# Patient Record
Sex: Female | Born: 1945 | Race: White | Hispanic: No | Marital: Single | Smoking: Never smoker
Health system: Southern US, Community
[De-identification: ages and names within clinical notes are randomized; demographics above are authoritative.]

## PROBLEM LIST (undated history)

## (undated) DIAGNOSIS — M51369 Other intervertebral disc degeneration, lumbar region without mention of lumbar back pain or lower extremity pain: Secondary | ICD-10-CM

## (undated) DIAGNOSIS — J309 Allergic rhinitis, unspecified: Secondary | ICD-10-CM

## (undated) DIAGNOSIS — E039 Hypothyroidism, unspecified: Secondary | ICD-10-CM

## (undated) DIAGNOSIS — E049 Nontoxic goiter, unspecified: Secondary | ICD-10-CM

## (undated) DIAGNOSIS — R011 Cardiac murmur, unspecified: Secondary | ICD-10-CM

## (undated) DIAGNOSIS — E785 Hyperlipidemia, unspecified: Secondary | ICD-10-CM

## (undated) DIAGNOSIS — K219 Gastro-esophageal reflux disease without esophagitis: Secondary | ICD-10-CM

## (undated) DIAGNOSIS — E782 Mixed hyperlipidemia: Secondary | ICD-10-CM

## (undated) DIAGNOSIS — M5136 Other intervertebral disc degeneration, lumbar region: Secondary | ICD-10-CM

## (undated) DIAGNOSIS — G8929 Other chronic pain: Secondary | ICD-10-CM

## (undated) DIAGNOSIS — IMO0001 Reserved for inherently not codable concepts without codable children: Secondary | ICD-10-CM

## (undated) DIAGNOSIS — E119 Type 2 diabetes mellitus without complications: Secondary | ICD-10-CM

## (undated) DIAGNOSIS — M549 Dorsalgia, unspecified: Secondary | ICD-10-CM

## (undated) DIAGNOSIS — I10 Essential (primary) hypertension: Secondary | ICD-10-CM

## (undated) DIAGNOSIS — J45909 Unspecified asthma, uncomplicated: Secondary | ICD-10-CM

## (undated) DIAGNOSIS — R9431 Abnormal electrocardiogram [ECG] [EKG]: Secondary | ICD-10-CM

## (undated) HISTORY — DX: Type 2 diabetes mellitus without complications: E11.9

## (undated) HISTORY — DX: Gastro-esophageal reflux disease without esophagitis: K21.9

## (undated) HISTORY — DX: Allergic rhinitis, unspecified: J30.9

## (undated) HISTORY — DX: Nontoxic goiter, unspecified: E04.9

## (undated) HISTORY — DX: Hyperlipidemia, unspecified: E78.5

## (undated) HISTORY — DX: Essential (primary) hypertension: I10

## (undated) HISTORY — DX: Reserved for inherently not codable concepts without codable children: IMO0001

## (undated) HISTORY — DX: Hypothyroidism, unspecified: E03.9

## (undated) HISTORY — DX: Abnormal electrocardiogram (ECG) (EKG): R94.31

## (undated) HISTORY — DX: Unspecified asthma, uncomplicated: J45.909

## (undated) HISTORY — DX: Other intervertebral disc degeneration, lumbar region: M51.36

## (undated) HISTORY — DX: Mixed hyperlipidemia: E78.2

---

## 1998-07-30 HISTORY — PX: COLONOSCOPY: SHX174

## 2008-09-21 ENCOUNTER — Ambulatory Visit (HOSPITAL_COMMUNITY): Admission: RE | Admit: 2008-09-21 | Discharge: 2008-09-21 | Payer: Self-pay | Admitting: Family Medicine

## 2010-02-20 ENCOUNTER — Ambulatory Visit (HOSPITAL_COMMUNITY): Admission: RE | Admit: 2010-02-20 | Discharge: 2010-02-20 | Payer: Self-pay | Admitting: Family Medicine

## 2010-02-27 HISTORY — PX: COLONOSCOPY: SHX174

## 2010-03-09 ENCOUNTER — Ambulatory Visit: Payer: Self-pay | Admitting: Internal Medicine

## 2010-03-09 DIAGNOSIS — Z86010 Personal history of colonic polyps: Secondary | ICD-10-CM

## 2010-03-09 DIAGNOSIS — K7689 Other specified diseases of liver: Secondary | ICD-10-CM | POA: Insufficient documentation

## 2010-03-14 ENCOUNTER — Encounter: Payer: Self-pay | Admitting: Internal Medicine

## 2010-03-22 ENCOUNTER — Ambulatory Visit (HOSPITAL_COMMUNITY): Admission: RE | Admit: 2010-03-22 | Discharge: 2010-03-22 | Payer: Self-pay | Admitting: Internal Medicine

## 2010-03-22 ENCOUNTER — Ambulatory Visit: Payer: Self-pay | Admitting: Internal Medicine

## 2010-03-23 ENCOUNTER — Encounter: Payer: Self-pay | Admitting: Internal Medicine

## 2010-08-29 NOTE — Assessment & Plan Note (Signed)
Summary: Consult for TCS- cdg   Visit Type:  Initial Consult Referring Rebecca Arellano:  Rebecca Arellano Primary Care Rebecca Arellano:  Rebecca Arellano  Chief Complaint:  consult for TCS/has kink in colon.  History of Present Illness: Rebecca Arellano is a pleasant 65 y/o WF, patient of Dr. Sudie Arellano, who presents to schedule TCS. 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 Exam was incomplete to hepatic flexure. Subsequent f/u exams have been incomplete but negative to the splenic flexure. Last exam in 2006. Patient has never had abdominal surgery. She denies abd pain, constipation, melena, brbpr, n/v, dysphagia, wt loss. She rarely has heartburn. Remote EGD unremarkable to her knowledge.  Current Medications (verified): 1)  Metformin Hcl 1000 Mg Tabs (Metformin Hcl) .... One Tablet in The Evening 2)  Levothroid 100 Mcg Tabs (Levothyroxine Sodium) .... Take 1 Tablet By Mouth Once A Day 3)  Amlodipine Besylate 10 Mg Tabs (Amlodipine Besylate) .... Take 1 Tablet By Mouth Once A Day 4)  Glimepiride 4 Mg Tabs (Glimepiride) .... One Tablet in The Am 5)  Simvastatin 40 Mg Tabs (Simvastatin) .... Take 1 Tablet By Mouth Once A Day 6)  Lisinopril 10 Mg Tabs (Lisinopril) .... Take 1 Tablet By Mouth Once A Day 7)  Aspirin 81 Mg Tbec (Aspirin) .... Take 1 Tablet By Mouth Once A Day 8)  Ibuprofen 200 Mg Tabs (Ibuprofen) .... 2-3 Tablets At Bedtime For Arthritis  Allergies (verified): No Known Drug Allergies  Past History:  Past Medical History: Diabetes H/O colonic polyps, tubulovillous adenoma (sigmoid colon, 2000). TCS 3/01, negative to splenic flexure/BE neg, TCS 7/06 neg to splenic flexure/BE neg.  Hypertension Hypothyroidism, h/o goiter Hyperlipidemia Fatty Liver Disease  Past Surgical History: None  Family History: No FH of CRC. Aunt, colon polyps Brother, colon polyps No FH of liver disease  Social History: Two children. Retired. Widow. Lives with significant other for past  four years.  Never smoked. No alcohol.   Review of Systems General:  Denies fever, chills, sweats, anorexia, fatigue, weakness, and weight loss. Eyes:  Denies vision loss. ENT:  Denies nasal congestion, hoarseness, and difficulty swallowing. CV:  Denies chest pains, angina, palpitations, dyspnea on exertion, and peripheral edema. Resp:  Denies dyspnea at rest, dyspnea with exercise, cough, and sputum. GI:  See HPI. GU:  Denies urinary burning and blood in urine. MS:  Denies joint pain / LOM. Derm:  Denies rash and itching. Neuro:  Denies weakness, frequent headaches, memory loss, and confusion. Psych:  Denies depression and anxiety. Endo:  Denies unusual weight change. Heme:  Denies bruising and bleeding. Allergy:  Denies hives and rash.  Vital Signs:  Patient profile:   65 year old female Height:      63 inches Weight:      174 pounds BMI:     30.93 Temp:     99.4 degrees F oral Pulse rate:   80 / minute BP sitting:   120 / 80  (left arm) Cuff size:   regular  Vitals Entered By: Rebecca Arellano (March 09, 2010 1:31 PM)  Physical Exam  General:  Well developed, well nourished, no acute distress. Head:  Normocephalic and atraumatic. Eyes:  sclera nonicteric Mouth:  Oropharyngeal mucosa moist, pink.  No lesions, erythema or exudate.    Neck:  Supple; no masses or thyromegaly. Lungs:  Clear throughout to auscultation. Heart:  Regular rate and rhythm; no murmurs, rubs,  or bruits. Abdomen:  Bowel sounds normal.  Abdomen is soft, nontender, nondistended.  No rebound or guarding.  No hepatosplenomegaly, masses or hernias.  No abdominal bruits.  Rectal:  deferred until time of colonoscopy.   Extremities:  No clubbing, cyanosis, edema or deformities noted. Neurologic:  Alert and  oriented x4;  grossly normal neurologically. Skin:  Intact without significant lesions or rashes. Cervical Nodes:  No significant cervical adenopathy. Psych:  Alert and cooperative. Normal mood and  affect.  Impression & Recommendations:  Problem # 1:  TUBULOVILLOUS ADENOMA, COLON, HX OF (ICD-V12.72)  H/O tubulovillous adenoma in 2000. Last TCS over five years ago. Actually never had complete TCS. Usually had ACBE to complete exam due to "kinking". Discussed options of attempting TCS here vs TCS with fluoroscopy at [facility]. She would like to try again here locally.   Colonoscopy to be performed in near future.  Risks, alternatives, and benefits including but not limited to the risk of reaction to medication, bleeding, infection, and perforation were addressed.  Patient voiced understanding and provided verbal consent.   Orders: Consultation Level III (56213)  Problem # 2:  FATTY LIVER DISEASE (ICD-571.8)  H/O fatty liver. Obtain labs from PCP for review.   Orders: Consultation Level III (08657) I would like to thank Dr. Sudie Arellano for allowing [place] to take part in the care of this nice patient.  Appended Document: Consult for TCS- cdg Please notify endo that pediatric scope needs to be available for this case.  Appended Document: Consult for TCS- cdg Faxed info to [employer].  Appended Document: Consult for TCS- cdg Labs from 03/03/10: glu 113 LFTs normal CBC normal HgbA1C 6.4%

## 2010-08-29 NOTE — Letter (Signed)
Summary: LABS FROM DR Lindaann Pascal FROM DR Sudie Bailey   Imported By: Rexene Alberts 03/23/2010 10:36:57  _____________________________________________________________________  External Attachment:    Type:   Image     Comment:   External Document

## 2010-08-29 NOTE — Letter (Signed)
Summary: Internal Other /TCS orders  Internal Other /TCS orders   Imported By: Cloria Spring LPN [id number] 42:59:56  _____________________________________________________________________  External Attachment:    Type:   Image     Comment:   External Document

## 2010-10-13 LAB — GLUCOSE, CAPILLARY: Glucose-Capillary: 171 mg/dL — ABNORMAL HIGH (ref 70–99)

## 2011-03-21 ENCOUNTER — Other Ambulatory Visit (HOSPITAL_COMMUNITY): Payer: Self-pay | Admitting: Family Medicine

## 2011-03-21 ENCOUNTER — Ambulatory Visit (HOSPITAL_COMMUNITY)
Admission: RE | Admit: 2011-03-21 | Discharge: 2011-03-21 | Disposition: A | Discharge status: Home / Self Care | Payer: Managed Care, Other (non HMO) | Source: Ambulatory Visit | Attending: Family Medicine | Admitting: Family Medicine

## 2011-03-21 DIAGNOSIS — M545 Low back pain, unspecified: Secondary | ICD-10-CM | POA: Insufficient documentation

## 2013-05-27 ENCOUNTER — Ambulatory Visit (INDEPENDENT_AMBULATORY_CARE_PROVIDER_SITE_OTHER): Payer: Managed Care, Other (non HMO) | Admitting: Cardiovascular Disease

## 2013-05-27 ENCOUNTER — Encounter: Payer: Self-pay | Admitting: *Deleted

## 2013-05-27 VITALS — BP 158/79 | HR 79 | Ht 63.0 in | Wt 177.0 lb

## 2013-05-27 DIAGNOSIS — M25511 Pain in right shoulder: Secondary | ICD-10-CM

## 2013-05-27 DIAGNOSIS — R011 Cardiac murmur, unspecified: Secondary | ICD-10-CM

## 2013-05-27 DIAGNOSIS — Z136 Encounter for screening for cardiovascular disorders: Secondary | ICD-10-CM

## 2013-05-27 DIAGNOSIS — M25519 Pain in unspecified shoulder: Secondary | ICD-10-CM

## 2013-05-27 NOTE — Patient Instructions (Signed)
Your physician recommends that you schedule a follow-up appointment in: 6 weeks. Your physician recommends that you continue on your current medications as directed. Please refer to the Current Medication list given to you today. Your physician has requested that you have an echocardiogram. Echocardiography is a painless test that uses sound waves to create images of your heart. It provides your doctor with information about the size and shape of your heart and how well your heart's chambers and valves are working. This procedure takes approximately one hour. There are no restrictions for this procedure.

## 2013-05-27 NOTE — Progress Notes (Signed)
Patient ID: Rebecca Arellano, female   DOB: 09-12-1945, 67 y.o.   MRN: [id number]       CARDIOLOGY CONSULT NOTE  Patient ID: Rebecca Arellano MRN: [id number] DOB/AGE: [birth year] 67 y.o.  Admit date: (Not on file) Primary Physician No primary provider on file.  Reason for Consultation:   HPI: Rebecca Arellano is a 67 yr old woman with HTN, diabetes, obesity, and hyperlipidemia. Lipids from 03/2013 show HDL 38, LDL 68, TC 158, and TG 258. She has been experiencing bilateral shoulder tightness and occasionally b/l upper arm tightness. She says she has been diagnosed with osteoarthritis of the left shoulder, and it sometimes "clicks" with movement. She also uses a laptop and hunches her shoulders when using it. The patient denies any symptoms of chest pain, palpitations, lightheadedness, dizziness, leg swelling, orthopnea, PND, and syncope.  She feels she's out of shape and sometimes has shortness of breath related to that.  She is getting ready to go to her daughter's wedding in [place] in December.  SocHx: moved here from [place], [place] 7 years ago. Has a boyfriend, and has 2 children.  FamHx; parents had heart disease. Sister has a murmur.    No Known Allergies  Current Outpatient Prescriptions  Medication Sig Dispense Refill  . amLODipine (NORVASC) 10 MG tablet Take 1 tablet by mouth daily.      Marland Kitchen atorvastatin (LIPITOR) 80 MG tablet Take 1 tablet by mouth daily.      Marland Kitchen glimepiride (AMARYL) 4 MG tablet Take 1 tablet by mouth daily. 1/2 tab daily      . levothyroxine (SYNTHROID, LEVOTHROID) 112 MCG tablet Take 1 tablet by mouth daily.      Marland Kitchen lisinopril (PRINIVIL,ZESTRIL) 10 MG tablet Take 1 tablet by mouth daily.      . metFORMIN (GLUCOPHAGE) 1000 MG tablet Take 1 tablet by mouth 2 (two) times daily.      . metroNIDAZOLE (FLAGYL) 500 MG tablet Take 1 tablet by mouth daily.       No current facility-administered medications for this visit.    Past Medical  History  Diagnosis Date  . Diabetes mellitus, type II   . Degenerative lumbar disc   . Asthma   . Allergic rhinitis   . Abnormal EKG   . Hypertension   . Other and unspecified hyperlipidemia   . Reflux   . Hypothyroidism     No past surgical history on file.  History   Social History  . Marital Status: Single    Spouse Name: N/A    Number of Children: N/A  . Years of Education: N/A   Occupational History  . Not on file.   Social History Main Topics  . Smoking status: Never Smoker   . Smokeless tobacco: Not on file  . Alcohol Use: Not on file  . Drug Use: Not on file  . Sexual Activity: Not on file   Other Topics Concern  . Not on file   Social History Narrative  . No narrative on file     No family history on file.   Prior to Admission medications   Medication Sig Start Date End Date Taking? Authorizing Provider  amLODipine (NORVASC) 10 MG tablet Take 1 tablet by mouth daily. 05/19/13  Yes Historical Provider, MD  atorvastatin (LIPITOR) 80 MG tablet Take 1 tablet by mouth daily. 04/18/13  Yes Historical Provider, MD  glimepiride (AMARYL) 4 MG tablet Take 1 tablet by mouth daily. 1/2 tab daily 04/15/13  Yes Historical Provider,  MD  levothyroxine (SYNTHROID, LEVOTHROID) 112 MCG tablet Take 1 tablet by mouth daily. 02/26/13  Yes Historical Provider, MD  lisinopril (PRINIVIL,ZESTRIL) 10 MG tablet Take 1 tablet by mouth daily. 03/26/13  Yes Historical Provider, MD  metFORMIN (GLUCOPHAGE) 1000 MG tablet Take 1 tablet by mouth 2 (two) times daily. 04/15/13  Yes Historical Provider, MD  metroNIDAZOLE (FLAGYL) 500 MG tablet Take 1 tablet by mouth daily. 04/30/13   Historical Provider, MD     Review of systems complete and found to be negative unless listed above in HPI     Physical exam Blood pressure 158/79, pulse 79, height 5\' 3"  (1.6 m), weight 177 lb (80.287 kg). General: NAD Neck: No JVD, no thyromegaly or thyroid nodule.  Lungs: Clear to auscultation bilaterally  with normal respiratory effort. CV: Nondisplaced PMI.  Heart regular S1/S2, no S3/S4, intermittent murmur at RUSB and LLSB.  No peripheral edema.  No carotid bruit.  Normal pedal pulses.  Abdomen: Soft, nontender, no hepatosplenomegaly, no distention.  Skin: Intact without lesions or rashes.  Neurologic: Alert and oriented x 3.  Psych: Normal affect. Extremities: No clubbing or cyanosis.  HEENT: Normal.   Labs:   No results found for this basename: WBC, HGB, HCT, MCV, PLT   No results found for this basename: NA, K, CL, CO2, BUN, CREATININE, CALCIUM, LABALBU, PROT, BILITOT, ALKPHOS, ALT, AST, GLUCOSE,  in the last 168 hours No results found for this basename: CKTOTAL, CKMB, CKMBINDEX, TROPONINI    No results found for this basename: CHOL   No results found for this basename: HDL   No results found for this basename: LDLCALC   No results found for this basename: TRIG   No results found for this basename: CHOLHDL   No results found for this basename: LDLDIRECT       EKG: Sinus rhythm, rate 90 bpm, LAFB   ASSESSMENT AND PLAN:  1. Bilateral shoulder pain: this appears to be very atypical for a cardiac etiology, as it sometimes exacerbated with shoulder rotation and she does admit to having poor posture when using her laptop. She also denies chest pain and fatigue. She does have an intermittent cardiac murmur. I will obtain an echocardiogram to evaluate for any valvular pathology and additional structural heart disease. 2. HTN: controlled on present therapy. 3. Hyperlipidemia: on high-dose Lipitor.  Signed: Prentice Docker, M.D., F.A.C.C.  05/27/2013, 11:53 AM

## 2013-05-29 ENCOUNTER — Ambulatory Visit (HOSPITAL_COMMUNITY)
Admission: RE | Admit: 2013-05-29 | Discharge: 2013-05-29 | Disposition: A | Discharge status: Home / Self Care | Payer: Medicare Other | Source: Ambulatory Visit | Attending: Cardiovascular Disease | Admitting: Cardiovascular Disease

## 2013-05-29 ENCOUNTER — Encounter: Payer: Self-pay | Admitting: Cardiovascular Disease

## 2013-05-29 DIAGNOSIS — R011 Cardiac murmur, unspecified: Secondary | ICD-10-CM | POA: Insufficient documentation

## 2013-05-29 DIAGNOSIS — I10 Essential (primary) hypertension: Secondary | ICD-10-CM | POA: Insufficient documentation

## 2013-05-29 DIAGNOSIS — I517 Cardiomegaly: Secondary | ICD-10-CM

## 2013-05-29 DIAGNOSIS — E669 Obesity, unspecified: Secondary | ICD-10-CM | POA: Insufficient documentation

## 2013-05-29 DIAGNOSIS — Z6831 Body mass index (BMI) 31.0-31.9, adult: Secondary | ICD-10-CM | POA: Insufficient documentation

## 2013-05-29 DIAGNOSIS — E119 Type 2 diabetes mellitus without complications: Secondary | ICD-10-CM | POA: Insufficient documentation

## 2013-05-29 NOTE — Progress Notes (Signed)
*  PRELIMINARY RESULTS* Echocardiogram 2D Echocardiogram has been performed.  Rebecca Arellano 05/29/2013, 2:44 PM

## 2013-08-03 ENCOUNTER — Encounter: Payer: Self-pay | Admitting: Cardiovascular Disease

## 2013-08-03 ENCOUNTER — Ambulatory Visit (INDEPENDENT_AMBULATORY_CARE_PROVIDER_SITE_OTHER): Payer: Medicare Other | Admitting: Cardiovascular Disease

## 2013-08-03 VITALS — BP 159/98 | HR 84 | Ht 62.0 in | Wt 176.0 lb

## 2013-08-03 DIAGNOSIS — M25512 Pain in left shoulder: Secondary | ICD-10-CM

## 2013-08-03 DIAGNOSIS — I519 Heart disease, unspecified: Secondary | ICD-10-CM

## 2013-08-03 DIAGNOSIS — M25511 Pain in right shoulder: Secondary | ICD-10-CM

## 2013-08-03 DIAGNOSIS — R011 Cardiac murmur, unspecified: Secondary | ICD-10-CM

## 2013-08-03 DIAGNOSIS — I10 Essential (primary) hypertension: Secondary | ICD-10-CM

## 2013-08-03 DIAGNOSIS — Z136 Encounter for screening for cardiovascular disorders: Secondary | ICD-10-CM

## 2013-08-03 DIAGNOSIS — M25519 Pain in unspecified shoulder: Secondary | ICD-10-CM

## 2013-08-03 MED ORDER — LISINOPRIL 20 MG PO TABS: 20.0000 mg | ORAL_TABLET | Freq: Every day | ORAL | Status: DC

## 2013-08-03 NOTE — Patient Instructions (Signed)
Your physician recommends that you schedule a follow-up appointment in: As Needed   Your physician has recommended you make the following change in your medication:  1. Increase Lisinopril to 20 mg daily.

## 2013-08-03 NOTE — Progress Notes (Signed)
Patient ID: Rebecca Arellano, female   DOB: 1945-12-04, 68 y.o.   MRN: [id number]      SUBJECTIVE: The patient is here to f/u on the results of cardiac testing, which showed normal LV systolic function, grade I diastolic dysfunction, mild LVH, and no significant valvular pathology. She denies chest pain and continues to occasionally experience shoulder tightness which she attributes to arthritis.    No Known Allergies  Current Outpatient Prescriptions  Medication Sig Dispense Refill  . amLODipine (NORVASC) 10 MG tablet Take 1 tablet by mouth daily.      Marland Kitchen atorvastatin (LIPITOR) 80 MG tablet Take 1 tablet by mouth daily.      Marland Kitchen glimepiride (AMARYL) 4 MG tablet Take 1 tablet by mouth daily. 1/2 tab daily      . levothyroxine (SYNTHROID, LEVOTHROID) 112 MCG tablet Take 1 tablet by mouth daily.      Marland Kitchen lisinopril (PRINIVIL,ZESTRIL) 10 MG tablet Take 1 tablet by mouth daily.      . metFORMIN (GLUCOPHAGE) 1000 MG tablet Take 1 tablet by mouth 2 (two) times daily.      . metroNIDAZOLE (FLAGYL) 500 MG tablet Take 1 tablet by mouth daily.       No current facility-administered medications for this visit.    Past Medical History  Diagnosis Date  . Diabetes mellitus, type II   . Degenerative lumbar disc   . Asthma   . Allergic rhinitis   . Abnormal EKG   . Hypertension   . Other and unspecified hyperlipidemia   . Reflux   . Hypothyroidism     No past surgical history on file.  History   Social History  . Marital Status: Single    Spouse Name: N/A    Number of Children: N/A  . Years of Education: N/A   Occupational History  . Not on file.   Social History Main Topics  . Smoking status: Never Smoker   . Smokeless tobacco: Not on file  . Alcohol Use: Not on file  . Drug Use: Not on file  . Sexual Activity: Not on file   Other Topics Concern  . Not on file   Social History Narrative  . No narrative on file     BP: 159/98 mmHg HR: 84 bpm  PHYSICAL EXAM General:  NAD Neck: No JVD, no thyromegaly or thyroid nodule.  Lungs: Clear to auscultation bilaterally with normal respiratory effort. CV: Nondisplaced PMI.  Heart regular S1/S2, no S3/S4, Intermittent 1/6 holosystolic murmur along left sternal border.  No peripheral edema.  No carotid bruit.  Normal pedal pulses.  Abdomen: Soft, nontender, no hepatosplenomegaly, no distention.  Neurologic: Alert and oriented x 3.  Psych: Normal affect. Extremities: No clubbing or cyanosis.   ECG: reviewed and available in electronic records.  Echo (05-29-13): - Left ventricle: The cavity size was normal. There was mild concentric hypertrophy. Systolic function was normal. The estimated ejection fraction was in the range of 55% to 60%. Wall motion was normal; there were no regional wall motion abnormalities. Doppler parameters are consistent with abnormal left ventricular relaxation (grade 1 diastolic dysfunction). - Aortic valve: Poorly visualized. Mildly calcified annulus. Probably trileaflet. No significant regurgitation. Mean gradient: 4mm Hg (S). - Left atrium: The atrium was mildly dilated. - Right ventricle: The cavity size was normal. Wall thickness was mildly to moderately increased. - Right atrium: Central venous pressure: 3mm Hg (est). - Tricuspid valve: Trivial regurgitation. - Pulmonary arteries: Systolic pressure could not be accurately estimated. -  Pericardium, extracardiac: A prominent pericardial fat pad was present. Impressions:  - No prior study for comparison. Mild LVH with LVEF 55-60%, grade 1 diastolic dysfunction. Mild left atrial enlargement. Aortic annular calcification. Normal RV size with mild to moderate increase in wall thickness. Trivial tricuspid regurgitation, unable to assess PASP. There is a very prominent, possible pericardial fat pad, mainly noted anteriorly, and with a component of more tissue density noted. If not already assessed, could consider CT imaging to  further characterize this area.     ASSESSMENT AND PLAN: 1. Hypertension: uncontrolled today, and was apparently elevated at another physician's office last week. Will increase lisinopril to 20 mg daily. 2. 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  Dispo: f/u prn.   Prentice DockerSuresh Koneswaran, M.D., F.A.C.C.

## 2014-03-16 ENCOUNTER — Other Ambulatory Visit (HOSPITAL_COMMUNITY): Payer: Self-pay | Admitting: Family Medicine

## 2014-03-16 ENCOUNTER — Other Ambulatory Visit (HOSPITAL_COMMUNITY): Payer: Self-pay | Admitting: Internal Medicine

## 2014-03-16 DIAGNOSIS — M48061 Spinal stenosis, lumbar region without neurogenic claudication: Secondary | ICD-10-CM

## 2014-03-19 ENCOUNTER — Ambulatory Visit (HOSPITAL_COMMUNITY)
Admission: RE | Admit: 2014-03-19 | Discharge: 2014-03-19 | Disposition: A | Discharge status: Home / Self Care | Payer: Medicare Other | Source: Ambulatory Visit | Attending: Internal Medicine | Admitting: Internal Medicine

## 2014-03-19 DIAGNOSIS — M51379 Other intervertebral disc degeneration, lumbosacral region without mention of lumbar back pain or lower extremity pain: Secondary | ICD-10-CM | POA: Insufficient documentation

## 2014-03-19 DIAGNOSIS — M545 Low back pain, unspecified: Secondary | ICD-10-CM | POA: Diagnosis present

## 2014-03-19 DIAGNOSIS — M47817 Spondylosis without myelopathy or radiculopathy, lumbosacral region: Secondary | ICD-10-CM | POA: Diagnosis not present

## 2014-03-19 DIAGNOSIS — R9389 Abnormal findings on diagnostic imaging of other specified body structures: Secondary | ICD-10-CM | POA: Insufficient documentation

## 2014-03-19 DIAGNOSIS — M5137 Other intervertebral disc degeneration, lumbosacral region: Secondary | ICD-10-CM | POA: Insufficient documentation

## 2014-03-19 DIAGNOSIS — M48061 Spinal stenosis, lumbar region without neurogenic claudication: Secondary | ICD-10-CM

## 2014-03-24 ENCOUNTER — Other Ambulatory Visit (HOSPITAL_COMMUNITY): Payer: Self-pay | Admitting: Family Medicine

## 2014-03-24 DIAGNOSIS — R9389 Abnormal findings on diagnostic imaging of other specified body structures: Secondary | ICD-10-CM

## 2014-03-29 ENCOUNTER — Telehealth: Payer: Self-pay | Admitting: Cardiovascular Disease

## 2014-03-29 MED ORDER — LISINOPRIL 20 MG PO TABS: 20.0000 mg | ORAL_TABLET | Freq: Every day | ORAL | Status: AC

## 2014-03-29 NOTE — Telephone Encounter (Signed)
Refill complete 

## 2014-03-29 NOTE — Telephone Encounter (Signed)
Received fax refill request  Rx # [id number] Medication:  Lisinopril 20 mg tab Qty 30 Sig:  Take one tablet by mouth once daily Physician:  Purvis Sheffield

## 2014-03-30 ENCOUNTER — Ambulatory Visit (HOSPITAL_COMMUNITY)
Admission: RE | Admit: 2014-03-30 | Discharge: 2014-03-30 | Disposition: A | Discharge status: Home / Self Care | Payer: Medicare Other | Source: Ambulatory Visit | Attending: Family Medicine | Admitting: Family Medicine

## 2014-03-30 DIAGNOSIS — K7689 Other specified diseases of liver: Secondary | ICD-10-CM | POA: Insufficient documentation

## 2014-03-30 DIAGNOSIS — R160 Hepatomegaly, not elsewhere classified: Secondary | ICD-10-CM | POA: Diagnosis not present

## 2014-03-30 DIAGNOSIS — N289 Disorder of kidney and ureter, unspecified: Secondary | ICD-10-CM | POA: Insufficient documentation

## 2014-03-30 DIAGNOSIS — R9389 Abnormal findings on diagnostic imaging of other specified body structures: Secondary | ICD-10-CM | POA: Insufficient documentation

## 2014-03-30 LAB — POCT I-STAT CREATININE: CREATININE: 0.7 mg/dL (ref 0.50–1.10)

## 2014-03-30 MED ORDER — GADOBENATE DIMEGLUMINE 529 MG/ML IV SOLN
15.0000 mL | Freq: Once | INTRAVENOUS | Status: AC | PRN
  Administered 2014-03-30: 15 mL via INTRAVENOUS

## 2014-05-04 ENCOUNTER — Other Ambulatory Visit: Payer: Self-pay | Admitting: Family Medicine

## 2014-05-04 DIAGNOSIS — M545 Low back pain: Secondary | ICD-10-CM

## 2014-05-06 ENCOUNTER — Ambulatory Visit
Admission: RE | Admit: 2014-05-06 | Discharge: 2014-05-06 | Disposition: A | Discharge status: Home / Self Care | Payer: Medicare Other | Source: Ambulatory Visit | Attending: Family Medicine | Admitting: Family Medicine

## 2014-05-06 VITALS — BP 183/78 | HR 105

## 2014-05-06 DIAGNOSIS — M545 Low back pain: Secondary | ICD-10-CM

## 2014-05-06 MED ORDER — IOHEXOL 180 MG/ML  SOLN
1.0000 mL | Freq: Once | INTRAMUSCULAR | Status: AC | PRN
  Administered 2014-05-06: 1 mL via EPIDURAL

## 2014-05-06 MED ORDER — METHYLPREDNISOLONE ACETATE 40 MG/ML INJ SUSP (RADIOLOG
120.0000 mg | Freq: Once | INTRAMUSCULAR | Status: AC
  Administered 2014-05-06: 120 mg via EPIDURAL

## 2014-05-06 NOTE — Discharge Instructions (Signed)

## 2014-10-04 ENCOUNTER — Other Ambulatory Visit: Payer: Self-pay | Admitting: Family Medicine

## 2014-10-04 DIAGNOSIS — M545 Low back pain: Secondary | ICD-10-CM

## 2014-10-05 ENCOUNTER — Ambulatory Visit
Admission: RE | Admit: 2014-10-05 | Discharge: 2014-10-05 | Disposition: A | Discharge status: Home / Self Care | Payer: Medicare Other | Source: Ambulatory Visit | Attending: Family Medicine | Admitting: Family Medicine

## 2014-10-05 DIAGNOSIS — M545 Low back pain: Secondary | ICD-10-CM

## 2014-10-05 MED ORDER — IOHEXOL 180 MG/ML  SOLN
1.0000 mL | Freq: Once | INTRAMUSCULAR | Status: AC | PRN
  Administered 2014-10-05: 1 mL via EPIDURAL

## 2014-10-05 MED ORDER — METHYLPREDNISOLONE ACETATE 40 MG/ML INJ SUSP (RADIOLOG
120.0000 mg | Freq: Once | INTRAMUSCULAR | Status: AC
  Administered 2014-10-05: 120 mg via EPIDURAL

## 2014-10-05 NOTE — Discharge Instructions (Signed)

## 2015-02-14 ENCOUNTER — Encounter: Payer: Self-pay | Admitting: Internal Medicine

## 2015-02-15 ENCOUNTER — Emergency Department (HOSPITAL_COMMUNITY)
Admission: EM | Admit: 2015-02-15 | Discharge: 2015-02-15 | Disposition: A | Discharge status: Home / Self Care | Payer: Medicare Other | Attending: Emergency Medicine | Admitting: Emergency Medicine

## 2015-02-15 ENCOUNTER — Encounter (HOSPITAL_COMMUNITY): Payer: Self-pay | Admitting: *Deleted

## 2015-02-15 ENCOUNTER — Emergency Department (HOSPITAL_COMMUNITY): Payer: Medicare Other

## 2015-02-15 DIAGNOSIS — S63501A Unspecified sprain of right wrist, initial encounter: Secondary | ICD-10-CM | POA: Insufficient documentation

## 2015-02-15 DIAGNOSIS — I10 Essential (primary) hypertension: Secondary | ICD-10-CM | POA: Diagnosis not present

## 2015-02-15 DIAGNOSIS — K219 Gastro-esophageal reflux disease without esophagitis: Secondary | ICD-10-CM | POA: Insufficient documentation

## 2015-02-15 DIAGNOSIS — E119 Type 2 diabetes mellitus without complications: Secondary | ICD-10-CM | POA: Insufficient documentation

## 2015-02-15 DIAGNOSIS — J45909 Unspecified asthma, uncomplicated: Secondary | ICD-10-CM | POA: Insufficient documentation

## 2015-02-15 DIAGNOSIS — Z79899 Other long term (current) drug therapy: Secondary | ICD-10-CM | POA: Insufficient documentation

## 2015-02-15 DIAGNOSIS — E785 Hyperlipidemia, unspecified: Secondary | ICD-10-CM | POA: Diagnosis not present

## 2015-02-15 DIAGNOSIS — W010XXA Fall on same level from slipping, tripping and stumbling without subsequent striking against object, initial encounter: Secondary | ICD-10-CM | POA: Insufficient documentation

## 2015-02-15 DIAGNOSIS — E039 Hypothyroidism, unspecified: Secondary | ICD-10-CM | POA: Diagnosis not present

## 2015-02-15 DIAGNOSIS — S6991XA Unspecified injury of right wrist, hand and finger(s), initial encounter: Secondary | ICD-10-CM | POA: Diagnosis present

## 2015-02-15 DIAGNOSIS — Y9289 Other specified places as the place of occurrence of the external cause: Secondary | ICD-10-CM | POA: Diagnosis not present

## 2015-02-15 DIAGNOSIS — Z87891 Personal history of nicotine dependence: Secondary | ICD-10-CM | POA: Insufficient documentation

## 2015-02-15 DIAGNOSIS — Y998 Other external cause status: Secondary | ICD-10-CM | POA: Insufficient documentation

## 2015-02-15 DIAGNOSIS — Z7982 Long term (current) use of aspirin: Secondary | ICD-10-CM | POA: Insufficient documentation

## 2015-02-15 DIAGNOSIS — Y9389 Activity, other specified: Secondary | ICD-10-CM | POA: Diagnosis not present

## 2015-02-15 HISTORY — DX: Other intervertebral disc degeneration, lumbar region without mention of lumbar back pain or lower extremity pain: M51.369

## 2015-02-15 HISTORY — DX: Other intervertebral disc degeneration, lumbar region: M51.36

## 2015-02-15 MED ORDER — TRAMADOL HCL 50 MG PO TABS: 50.0000 mg | ORAL_TABLET | Freq: Four times a day (QID) | ORAL | Status: DC | PRN

## 2015-02-15 NOTE — Discharge Instructions (Signed)
Ligament Sprain °A ligament sprain is when the bands of tissue that hold bones together (ligament) are stretched. °HOME CARE  °· Rest the injured area. °· Start using the joint when told to by your doctor. °· Keep the injured area raised (elevated) above the level of the heart. This may lessen puffiness (swelling). °· Put ice on the injured area. °¨ Put ice in a plastic bag. °¨ Place a towel between your skin and the bag. °¨ Leave the ice on for 15-20 minutes, 03-04 times a day. °· Wear a splint, cast, or an elastic bandage as told by your doctor. °· Only take medicine as told by your doctor. °· Use crutches as told by your doctor. Do not put weight on the injured joint until told to by your doctor. °GET HELP RIGHT AWAY IF:  °· You have more bruising, puffiness, or pain. °· The leg was injured and the toes are cold, tingling, numb, or blue. °· The arm was injured and the fingers are cold, tingling, numb, or blue. °· The pain is not helped with medicine. °· The pain gets worse. °MAKE SURE YOU:  °· Understand these instructions. °· Will watch this condition. °· Will get help right away if you are not doing well or get worse. °Document Released: 01/02/2008 Document Revised: 05/06/2013 Document Reviewed: 01/02/2008 °Exit[employer]® Patient Information ©2015 [employer]. This information is not intended to replace advice given to you by your health care provider. Make sure you discuss any questions you have with your health care provider. ° °

## 2015-02-15 NOTE — ED Notes (Signed)
Patient slipped in mud puddle and fell onto R wrist.  Swelling noted at distal radial edge. CMS intact.

## 2015-02-15 NOTE — ED Notes (Signed)
Patient with no complaints at this time. Respirations even and unlabored. Skin warm/dry. Discharge instructions reviewed with patient at this time. Patient given opportunity to voice concerns/ask questions. Patient discharged at this time and left Emergency Department with steady gait.   

## 2015-02-16 NOTE — ED Provider Notes (Signed)
CSN: [id number]     Arrival date & time 02/15/15  1136 History   First MD Initiated Contact with Patient 02/15/15 1204     Chief Complaint  Patient presents with  . Wrist Injury     (Consider location/radiation/quality/duration/timing/severity/associated sxs/prior Treatment) HPI  Rebecca Arellano is a 69 y.o. female who presents to the Emergency Department complaining of right wrist pain and swelling after a mechanical fall on an outstretched hand.  Reports immediate swelling at the joint.  Pain is worse with wrist movement.  She denies numbness or weakness of the extremity, elbow pain, head injury, neck pain or LOC.  She has applied ice to her wrist with moderate relief.     Past Medical History  Diagnosis Date  . Diabetes mellitus, type II   . Degenerative lumbar disc   . Asthma   . Allergic rhinitis   . Abnormal EKG   . Hypertension   . Other and unspecified hyperlipidemia   . Reflux   . Hypothyroidism   . DDD (degenerative disc disease), lumbar    History reviewed. No pertinent past surgical history. History reviewed. No pertinent family history. History  Substance Use Topics  . Smoking status: Former Games developer  . Smokeless tobacco: Not on file  . Alcohol Use: No   OB History    No data available     Review of Systems  Constitutional: Negative for fever and chills.  Cardiovascular: Negative for chest pain.  Gastrointestinal: Negative for nausea and vomiting.  Musculoskeletal: Positive for joint swelling and arthralgias (right wrist pain).  Skin: Negative for color change and wound.  Neurological: Negative for weakness and numbness.  All other systems reviewed and are negative.     Allergies  Review of patient's allergies indicates no known allergies.  Home Medications   Prior to Admission medications   Medication Sig Start Date End Date Taking? Authorizing Provider  amLODipine (NORVASC) 10 MG tablet Take 1 tablet by mouth daily. 05/19/13  Yes Historical  Provider, MD  aspirin 325 MG tablet Take 325 mg by mouth daily.   Yes Historical Provider, MD  atorvastatin (LIPITOR) 80 MG tablet Take 1 tablet by mouth daily. 04/18/13  Yes Historical Provider, MD  glimepiride (AMARYL) 4 MG tablet Take 0.5-1 tablets by mouth See admin instructions. 1/2 tab daily = BS<70 and 1 tab daily = BS>70. 04/15/13  Yes Historical Provider, MD  ibuprofen (ADVIL,MOTRIN) 200 MG tablet Take 800 mg by mouth 3 (three) times daily.   Yes Historical Provider, MD  levothyroxine (SYNTHROID, LEVOTHROID) 100 MCG tablet Take 1 tablet by mouth daily. 02/04/15  Yes Historical Provider, MD  lisinopril (PRINIVIL,ZESTRIL) 20 MG tablet Take 1 tablet (20 mg total) by mouth daily. 03/29/14  Yes Laqueta Linden, MD  metFORMIN (GLUCOPHAGE) 1000 MG tablet Take 1 tablet by mouth 2 (two) times daily. 04/15/13  Yes Historical Provider, MD  traMADol (ULTRAM) 50 MG tablet Take 1 tablet (50 mg total) by mouth every 6 (six) hours as needed. 02/15/15   Odes Lolli, PA-C   BP 161/86 mmHg  Pulse 95  Temp(Src) 98.3 F (36.8 C) (Oral)  Resp 16  SpO2 100% Physical Exam  Constitutional: She is oriented to person, place, and time. She appears well-developed and well-nourished. No distress.  HENT:  Head: Normocephalic and atraumatic.  Neck: Normal range of motion.  Cardiovascular: Normal rate, regular rhythm and normal heart sounds.   Pulmonary/Chest: Effort normal and breath sounds normal. No respiratory distress.  Musculoskeletal: She exhibits tenderness.  She exhibits no edema.  Tenderness of the radial aspect of the distal right wrist.  Radial pulse is brisk, distal sensation intact.  CR< 2 sec.  No bruising or bony deformity.  No proximal tenderness or edema.  Compartments are soft.  Neurological: She is alert and oriented to person, place, and time. She exhibits normal muscle tone. Coordination normal.  Skin: Skin is warm and dry.  Nursing note and vitals reviewed.   ED Course  Procedures  (including critical care time) Labs Review Labs Reviewed - No data to display  Imaging Review Dg Wrist Complete Right  02/15/2015   CLINICAL DATA:  Fall today with pain and swelling in right wrist  EXAM: RIGHT WRIST - COMPLETE 3+ VIEW  COMPARISON:  None.  FINDINGS: There is no evidence of fracture or dislocation. There is no evidence of arthropathy or other focal bone abnormality. Soft tissues are unremarkable.  IMPRESSION: No acute bony abnormality.   Electronically Signed   By: Charlett NoseKevin  Dover M.D.   On: 02/15/2015 12:12     EKG Interpretation None      MDM   Final diagnoses:  Sprain of wrist, right, initial encounter    XR of wrist is neg for fx.  Tenderness and edema of the radial side of the wrist.  Likely tendon injury.  Remains NV intact,  velcro wrist splint applied.  Pain improved.    She agrees to RICE therapy and close orthopedic f/u.      Pauline Ausammy Sharmane Dame, PA-C 02/16/15 2002  Bethann BerkshireJoseph Zammit, MD 02/17/15 [phone]

## 2015-03-15 ENCOUNTER — Ambulatory Visit (INDEPENDENT_AMBULATORY_CARE_PROVIDER_SITE_OTHER): Payer: Medicare Other | Admitting: Orthopedic Surgery

## 2015-03-15 ENCOUNTER — Encounter: Payer: Self-pay | Admitting: Orthopedic Surgery

## 2015-03-15 VITALS — BP 140/81 | Ht 62.0 in | Wt 170.0 lb

## 2015-03-15 DIAGNOSIS — S63501A Unspecified sprain of right wrist, initial encounter: Secondary | ICD-10-CM

## 2015-03-15 NOTE — Patient Instructions (Signed)
Wear  brace 4 more weeks  

## 2015-03-15 NOTE — Progress Notes (Signed)
Patient ID: Rebecca Arellano, female   DOB: Jul 25, 1946, 69 y.o.   MRN: [id number]  Chief Complaint  Patient presents with  . Wrist Injury    er follow up right wrist pain s/p fall, DOI 02/15/15    HPI Rebecca Arellano is a 69 y.o. female.  The patient has fallen on her wrist about 4 weeks ago she was seen in the ER x-rays were negative. She will splint off and on for 4 weeks presents for evaluation for ongoing pain over the right wrist joint. No numbness no tingling. She complains of pain over the radiocarpal joint with some radiation proximally and distally but no loss of function.  Review of Systems Review of Systems She has a history of chronic back pain  Some upper GI symptoms at times from her reflux    Past Medical History  Diagnosis Date  . Diabetes mellitus, type II   . Degenerative lumbar disc   . Asthma   . Allergic rhinitis   . Abnormal EKG   . Hypertension   . Other and unspecified hyperlipidemia   . Reflux   . Hypothyroidism   . DDD (degenerative disc disease), lumbar     No past surgical history on file.  No family history on file.  Social History Social History  Substance Use Topics  . Smoking status: Former Games developer  . Smokeless tobacco: None  . Alcohol Use: No    No Known Allergies  Current Outpatient Prescriptions  Medication Sig Dispense Refill  . amLODipine (NORVASC) 10 MG tablet Take 1 tablet by mouth daily.    Marland Kitchen aspirin 325 MG tablet Take 325 mg by mouth daily.    Marland Kitchen atorvastatin (LIPITOR) 80 MG tablet Take 1 tablet by mouth daily.    Marland Kitchen glimepiride (AMARYL) 4 MG tablet Take 0.5-1 tablets by mouth See admin instructions. 1/2 tab daily = BS<70 and 1 tab daily = BS>70.    . ibuprofen (ADVIL,MOTRIN) 200 MG tablet Take 800 mg by mouth 3 (three) times daily.    Marland Kitchen levothyroxine (SYNTHROID, LEVOTHROID) 100 MCG tablet Take 1 tablet by mouth daily.    Marland Kitchen lisinopril (PRINIVIL,ZESTRIL) 20 MG tablet Take 1 tablet (20 mg total) by mouth daily. 30 tablet 6  .  metFORMIN (GLUCOPHAGE) 1000 MG tablet Take 1 tablet by mouth 2 (two) times daily.     No current facility-administered medications for this visit.       Physical Exam Physical Exam Blood pressure 140/81, height  (1.575 m), weight 170 lb (77.111 kg).  she is awake alert and oriented 3 mood is pleasant gait is normal  Inspection of the right wrist and upper extremity reveals tenderness over the radiocarpal joint mild painful range of motion but full range of motion no instability Watson test negative elbow normal motor exam intact neurovascular exam also normal lymph nodes negative in the epitrochlear region skin normal over the hand wrist and forearm   Data Reviewed I reviewed the images of the right hand she has some mild scaphoid trapezial arthritis but otherwise no evidence of fractureent    Encounter Diagnosis  Name Primary?  . Sprain of right wrist, initial encounter Yes        Plan    She is already returned to near full ADLs so I advised 4 more weeks of intermittent brace wear when she can. She can remove it for past that require more dexterity  Follow-up as needed        Fuller Canada  03/15/2015, 9:39 AM

## 2016-06-26 ENCOUNTER — Telehealth: Payer: Self-pay

## 2016-06-26 NOTE — Telephone Encounter (Signed)
Pt called to schedule her colonoscopy. Her triage letter was mailed today. I told her that the triage nurse would be contacting her back. [phone]

## 2016-06-26 NOTE — Telephone Encounter (Signed)
Pt has hx of tubulovillous adenoma in [place] Ov with Tana CoastLeslie Lewis, PA on 07/09/2016 at 9:30 Am.

## 2016-07-09 ENCOUNTER — Encounter: Payer: Self-pay | Admitting: Gastroenterology

## 2016-07-09 ENCOUNTER — Telehealth: Payer: Self-pay

## 2016-07-09 ENCOUNTER — Other Ambulatory Visit: Payer: Self-pay

## 2016-07-09 ENCOUNTER — Ambulatory Visit (INDEPENDENT_AMBULATORY_CARE_PROVIDER_SITE_OTHER): Payer: Medicare Other | Admitting: Gastroenterology

## 2016-07-09 VITALS — BP 136/84 | HR 85 | Temp 97.4°F | Ht 63.0 in | Wt 166.0 lb

## 2016-07-09 DIAGNOSIS — Z86010 Personal history of colonic polyps: Secondary | ICD-10-CM

## 2016-07-09 DIAGNOSIS — K5904 Chronic idiopathic constipation: Secondary | ICD-10-CM

## 2016-07-09 DIAGNOSIS — R1012 Left upper quadrant pain: Secondary | ICD-10-CM | POA: Diagnosis not present

## 2016-07-09 DIAGNOSIS — R1032 Left lower quadrant pain: Secondary | ICD-10-CM

## 2016-07-09 DIAGNOSIS — Z8379 Family history of other diseases of the digestive system: Secondary | ICD-10-CM | POA: Insufficient documentation

## 2016-07-09 MED ORDER — PEG 3350-KCL-NA BICARB-NACL 420 G PO SOLR: 4000.0000 mL | ORAL | 0 refills | Status: DC

## 2016-07-09 NOTE — Telephone Encounter (Signed)
Called pt and informed of pre-op appt 08/02/16 at 8:00 am.

## 2016-07-09 NOTE — Patient Instructions (Signed)
PA for TCS: [id number]

## 2016-07-09 NOTE — Progress Notes (Signed)
cc'ed to pcp °

## 2016-07-09 NOTE — Assessment & Plan Note (Signed)
Screening labs due to FH.

## 2016-07-09 NOTE — Assessment & Plan Note (Signed)
Overdue for surveillance colonoscopy at this time. She received significant amount of Demerol and Versed with conscious sedation previously. Given this and the fact that her colon is difficult to examine, we will plan on deep sedation in the OR with Dr. Jena Gaussourk.  I have discussed the risks, alternatives, benefits with regards to but not limited to the risk of reaction to medication, bleeding, infection, perforation and the patient is agreeable to proceed. Written consent to be obtained.

## 2016-07-09 NOTE — Assessment & Plan Note (Signed)
Intermittent left lower quadrant pain in the setting of tortuous colon, diverticulosis, constipation. No recent clinical findings to suggest diverticulitis. Abdominal exam benign today. Pursue colonoscopy as planned. She also has some left upper quadrant pain intermittently. Now on labs today including celiac screening labs, CBC, lipase. If these are unremarkable and colonoscopy is unremarkable, could consider pursuing CT for ongoing symptoms. Also planning to add Linzess for management of constipation. FODMAP diet and high fiber diet handouts discussed and provided.

## 2016-07-09 NOTE — Patient Instructions (Signed)
1. Please add a fiber supplement to your diet. You should take 3-4 grams daily. 2. Trial of Linzess 72mcg daily on empty stomach for constipation. Hold for diarrhea.  3. Please have your labs done.  4. Colonoscopy as scheduled. See separate instructions.  5. Refer to the FODMAP diet. Choose things on the Low side NOT the high side.    High-Fiber Diet Fiber, also called dietary fiber, is a type of carbohydrate found in fruits, vegetables, whole grains, and beans. A high-fiber diet can have many health benefits. Your health care provider may recommend a high-fiber diet to help:  Prevent constipation. Fiber can make your bowel movements more regular.  Lower your cholesterol.  Relieve hemorrhoids, uncomplicated diverticulosis, or irritable bowel syndrome.  Prevent overeating as part of a weight-loss plan.  Prevent heart disease, type 2 diabetes, and certain cancers. What is my plan? The recommended daily intake of fiber includes:  38 grams for men under age 69.  30 grams for men over age 69.  25 grams for women under age 350.  21 grams for women over age 69. You can get the recommended daily intake of dietary fiber by eating a variety of fruits, vegetables, grains, and beans. Your health care provider may also recommend a fiber supplement if it is not possible to get enough fiber through your diet. What do I need to know about a high-fiber diet?  Fiber supplements have not been widely studied for their effectiveness, so it is better to get fiber through food sources.  Always check the fiber content on thenutrition facts label of any prepackaged food. Look for foods that contain at least 5 grams of fiber per serving.  Ask your dietitian if you have questions about specific foods that are related to your condition, especially if those foods are not listed in the following section.  Increase your daily fiber consumption gradually. Increasing your intake of dietary fiber too quickly may  cause bloating, cramping, or gas.  Drink plenty of water. Water helps you to digest fiber. What foods can I eat? Grains  Whole-grain breads. Multigrain cereal. Oats and oatmeal. Brown rice. Barley. Bulgur wheat. Millet. Bran muffins. Popcorn. Rye wafer crackers. Vegetables  Sweet potatoes. Spinach. Kale. Artichokes. Cabbage. Broccoli. Green peas. Carrots. Squash. Fruits  Berries. Pears. Apples. Oranges. Avocados. Prunes and raisins. Dried figs. Meats and Other Protein Sources  Navy, kidney, pinto, and soy beans. Split peas. Lentils. Nuts and seeds. Dairy  Fiber-fortified yogurt. Beverages  Fiber-fortified soy milk. Fiber-fortified orange juice. Other  Fiber bars. The items listed above may not be a complete list of recommended foods or beverages. Contact your dietitian for more options.  What foods are not recommended? Grains  White bread. Pasta made with refined flour. White rice. Vegetables  Fried potatoes. Canned vegetables. Well-cooked vegetables. Fruits  Fruit juice. Cooked, strained fruit. Meats and Other Protein Sources  Fatty cuts of meat. Fried Environmental education officerpoultry or fried fish. Dairy  Milk. Yogurt. Cream cheese. Sour cream. Beverages  Soft drinks. Other  Cakes and pastries. Butter and oils. The items listed above may not be a complete list of foods and beverages to avoid. Contact your dietitian for more information.  What are some tips for including high-fiber foods in my diet?  Eat a wide variety of high-fiber foods.  Make sure that half of all grains consumed each day are whole grains.  Replace breads and cereals made from refined flour or white flour with whole-grain breads and cereals.  Replace white rice  with brown rice, bulgur wheat, or millet.  Start the day with a breakfast that is high in fiber, such as a cereal that contains at least 5 grams of fiber per serving.  Use beans in place of meat in soups, salads, or pasta.  Eat high-fiber snacks, such as berries,  raw vegetables, nuts, or popcorn. This information is not intended to replace advice given to you by your health care provider. Make sure you discuss any questions you have with your health care provider. Document Released: 07/16/2005 Document Revised: 12/22/2015 Document Reviewed: 12/29/2013 Elsevier Interactive Patient Education  2017 [employer]

## 2016-07-09 NOTE — Assessment & Plan Note (Signed)
Chronic constipation since childhood. May go to 3 days without a bowel movement. Rarely has diarrhea. Trial of Linzess daily on empty stomach. Samples provided. She will call for RX if helpful.

## 2016-07-09 NOTE — Progress Notes (Addendum)
Primary Care Physician:  Milana ObeyStephen D Knowlton, MD  Primary Gastroenterologist:  Roetta SessionsMichael Rourk, MD   Chief Complaint  Patient presents with  . Colonoscopy    HPI:  Rebecca Arellano is a 70 y.o. female here for further evaluation of abdominal pain, bowel concerns, to schedule her overdue colonoscopy. 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 Exam was incomplete on several attempts requiring barium enemas. These were all done in [place]. She had a colonoscopy in 2011 by Dr. Jena Gaussourk. Patient was found to have a long redundant colon requiring number of maneuvers including external abdominal pressure to get to the syncope. Completed with adult colonoscope. She had left-sided diverticula but no polyps. Recommended repeat surveillance colonoscopy in 5 years. Patient has been putting this off.   Now that she's having more trouble with her stomach and bowel issues she decided to pursue colonoscopy. Couple months ago she was treated for diverticulitis with Cipro and Flagyl. She keeps antibiotics on hand. States had been on long time prior to that episode that she required antibiotics. She's never been hospitalized with it.   She complains of intermittent left lower quadrant pain. Sometimes better after bowel movement. No persistent pain recently. No dysuria. She's also had some left upper quadrant pain to is improved with BM. She's concerned because her brother has a history of idiopathic pancreatitis. She also has a brother with celiac disease. He went undiagnosed for years. "He almost died from it". She denies melena rectal bleeding. Complains of postprandial abdominal cramping at times sometimes with and without bowel movements. No upper GI symptoms. No unintentional weight loss.      Current Outpatient Prescriptions  Medication Sig Dispense Refill  . amLODipine (NORVASC) 10 MG tablet Take 1 tablet by mouth daily.    Marland Kitchen. aspirin 325 MG tablet Take 325 mg  by mouth daily.    Marland Kitchen. atorvastatin (LIPITOR) 80 MG tablet Take 1 tablet by mouth daily.    Marland Kitchen. glimepiride (AMARYL) 4 MG tablet Take 0.5-1 tablets by mouth See admin instructions. 1/2 tab daily = BS<70 and 1 tab daily = BS>70.    . levothyroxine (SYNTHROID, LEVOTHROID) 100 MCG tablet Take 1 tablet by mouth daily.    Marland Kitchen. lisinopril (PRINIVIL,ZESTRIL) 20 MG tablet Take 1 tablet (20 mg total) by mouth daily. 30 tablet 6  . metFORMIN (GLUCOPHAGE) 1000 MG tablet Take 1 tablet by mouth 2 (two) times daily.    . Probiotic Product (PROBIOTIC DAILY PO) Take by mouth.    . traMADol (ULTRAM) 50 MG tablet      No current facility-administered medications for this visit.     Allergies as of 07/09/2016  . (No Known Allergies)    Past Medical History:  Diagnosis Date  . Abnormal EKG   . Allergic rhinitis   . Asthma   . DDD (degenerative disc disease), lumbar   . Degenerative lumbar disc   . Diabetes mellitus, type II (HCC)   . Goiter   . Hypertension   . Hypothyroidism   . Other and unspecified hyperlipidemia   . Reflux     Past Surgical History:  Procedure Laterality Date  . 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  . COLONOSCOPY  02/2010   Dr. Jena Gaussourk, left-sided diverticula, surveillance colonoscopy recommended for 5 years    Family History  Problem Relation Age of Onset  . Colitis Mother   . Pancreatitis Brother     No etoh.  idiopathic per patient.   . Celiac disease Brother   . Irritable bowel syndrome Brother   . Colon cancer Neg Hx   . Inflammatory bowel disease Neg Hx     Social History   Social History  . Marital status: Single    Spouse name: N/A  . Number of children: N/A  . Years of education: N/A   Occupational History  . Not on file.   Social History Main Topics  . Smoking status: Former Games developermoker  . Smokeless tobacco: Never Used  . Alcohol use No  . Drug use: No  . Sexual activity: Not on file   Other Topics Concern  . Not  on file   Social History Narrative  . No narrative on file      ROS:  General: Negative for anorexia, weight loss, fever, chills, fatigue, weakness. Eyes: Negative for vision changes.  ENT: Negative for hoarseness, difficulty swallowing , nasal congestion. CV: Negative for chest pain, angina, palpitations, dyspnea on exertion, peripheral edema.  Respiratory: Negative for dyspnea at rest, dyspnea on exertion, cough, sputum, wheezing.  GI: See history of present illness. GU:  Negative for dysuria, hematuria, urinary incontinence, urinary frequency, nocturnal urination.  MS: Negative for joint pain. Positive low back pain.  Derm: Negative for rash or itching.  Neuro: Negative for weakness, abnormal sensation, seizure, frequent headaches, memory loss, confusion.  Psych: Negative for anxiety, depression, suicidal ideation, hallucinations.  Endo: Negative for unusual weight change.  Heme: Negative for bruising or bleeding. Allergy: Negative for rash or hives.    Physical Examination:  BP 136/84   Pulse 85   Temp 97.4 F (36.3 C) (Oral)   Ht 5\' 3"  (1.6 m)   Wt 166 lb (75.3 kg)   BMI 29.41 kg/m    General: Well-nourished, well-developed in no acute distress.  Head: Normocephalic, atraumatic.   Eyes: Conjunctiva pink, no icterus. Mouth: Oropharyngeal mucosa moist and pink , no lesions erythema or exudate. Neck: Supple without thyromegaly, masses, or lymphadenopathy.  Lungs: Clear to auscultation bilaterally.  Heart: Regular rate and rhythm, no murmurs rubs or gallops.  Abdomen: Bowel sounds are normal, nontender, nondistended, no hepatosplenomegaly or masses, no abdominal bruits or    hernia , no rebound or guarding.   Rectal: Deferred Extremities: No lower extremity edema. No clubbing or deformities.  Neuro: Alert and oriented x 4 , grossly normal neurologically.  Skin: Warm and dry, no rash or jaundice.   Psych: Alert and cooperative, normal mood and affect.    Imaging  Studies: No results found.

## 2016-07-11 LAB — CBC WITH DIFFERENTIAL/PLATELET
Basophils Absolute: 93 cells/uL (ref 0–200)
Basophils Relative: 1 %
EOS PCT: 7 %
Eosinophils Absolute: 651 cells/uL — ABNORMAL HIGH (ref 15–500)
HCT: 39.3 % (ref 35.0–45.0)
Hemoglobin: 12.6 g/dL (ref 11.7–15.5)
LYMPHS ABS: 2046 {cells}/uL (ref 850–3900)
Lymphocytes Relative: 22 %
MCH: 30.1 pg (ref 27.0–33.0)
MCHC: 32.1 g/dL (ref 32.0–36.0)
MCV: 94 fL (ref 80.0–100.0)
MPV: 9.9 fL (ref 7.5–12.5)
Monocytes Absolute: 651 cells/uL (ref 200–950)
Monocytes Relative: 7 %
NEUTROS PCT: 63 %
Neutro Abs: 5859 cells/uL (ref 1500–7800)
Platelets: 341 10*3/uL (ref 140–400)
RBC: 4.18 MIL/uL (ref 3.80–5.10)
RDW: 14.3 % (ref 11.0–15.0)
WBC: 9.3 10*3/uL (ref 3.8–10.8)

## 2016-07-11 LAB — COMPREHENSIVE METABOLIC PANEL
ALT: 20 U/L (ref 6–29)
AST: 20 U/L (ref 10–35)
Albumin: 4.6 g/dL (ref 3.6–5.1)
Alkaline Phosphatase: 79 U/L (ref 33–130)
BUN: 17 mg/dL (ref 7–25)
CO2: 23 mmol/L (ref 20–31)
Calcium: 9.5 mg/dL (ref 8.6–10.4)
Chloride: 102 mmol/L (ref 98–110)
Creat: 0.84 mg/dL (ref 0.50–0.99)
Glucose, Bld: 196 mg/dL — ABNORMAL HIGH (ref 65–99)
POTASSIUM: 3.9 mmol/L (ref 3.5–5.3)
SODIUM: 139 mmol/L (ref 135–146)
Total Bilirubin: 0.6 mg/dL (ref 0.2–1.2)
Total Protein: 6.8 g/dL (ref 6.1–8.1)

## 2016-07-12 LAB — LIPASE: LIPASE: 50 U/L (ref 7–60)

## 2016-07-12 LAB — IGA: IGA: 203 mg/dL (ref 81–463)

## 2016-07-12 LAB — TISSUE TRANSGLUTAMINASE, IGA: Tissue Transglutaminase Ab, IgA: 1 U/mL (ref <4)

## 2016-07-15 NOTE — Progress Notes (Signed)
Celiac screen negative. No anemia. LFTs normal. Pancreas labs normal. Kidney function labs normal.  Sugar was high 196.  TCS as scheduled.

## 2016-07-31 NOTE — Patient Instructions (Signed)
Rebecca Arellano  07/31/2016     @PREFPERIOPPHARMACY @   Your procedure is scheduled on 08/06/2016.  Report to [facility] at 6:15 A.M.  Call this number if you have problems the morning of surgery:  [phone]   Remember:  Do not eat food or drink liquids after midnight.  Take these medicines the morning of surgery with A SIP OF WATER Amlodipine, Synthroid, Lisinopril, Ultram  DO NOT TAKE DIABETIC MEDICATIONS MORNING OF PROCEDURE   Do not wear jewelry, make-up or nail polish.  Do not wear lotions, powders, or perfumes, or deoderant.  Do not shave 48 hours prior to surgery.  Men may shave face and neck.  Do not bring valuables to the hospital.  [facility] is not responsible for any belongings or valuables.  Contacts, dentures or bridgework may not be worn into surgery.  Leave your suitcase in the car.  After surgery it may be brought to your room.  For patients admitted to the hospital, discharge time will be determined by your treatment team.  Patients discharged the day of surgery will not be allowed to drive home.    Please read over the following fact sheets that you were given. Anesthesia Post-op Instructions     PATIENT INSTRUCTIONS POST-ANESTHESIA  IMMEDIATELY FOLLOWING SURGERY:  Do not drive or operate machinery for the first twenty four hours after surgery.  Do not make any important decisions for twenty four hours after surgery or while taking narcotic pain medications or sedatives.  If you develop intractable nausea and vomiting or a severe headache please notify your doctor immediately.  FOLLOW-UP:  Please make an appointment with your surgeon as instructed. You do not need to follow up with anesthesia unless specifically instructed to do so.  WOUND CARE INSTRUCTIONS (if applicable):  Keep a dry clean dressing on the anesthesia/puncture wound site if there is drainage.  Once the wound has quit draining you may leave it open to air.  Generally you should leave the  bandage intact for twenty four hours unless there is drainage.  If the epidural site drains for more than 36-48 hours please call the anesthesia department.  QUESTIONS?:  Please feel free to call your physician or the hospital operator if you have any questions, and they will be happy to assist you.      Colonoscopy, Adult A colonoscopy is an exam to look at the entire large intestine. During the exam, a lubricated, bendable tube is inserted into the anus and then passed into the rectum, colon, and other parts of the large intestine. A colonoscopy is often done as a part of normal colorectal screening or in response to certain symptoms, such as anemia, persistent diarrhea, abdominal pain, and blood in the stool. The exam can help screen for and diagnose medical problems, including:  Tumors.  Polyps.  Inflammation.  Areas of bleeding. Tell a health care provider about:  Any allergies you have.  All medicines you are taking, including vitamins, herbs, eye drops, creams, and over-the-counter medicines.  Any problems you or family members have had with anesthetic medicines.  Any blood disorders you have.  Any surgeries you have had.  Any medical conditions you have.  Any problems you have had passing stool. What are the risks? Generally, this is a safe procedure. However, problems may occur, including:  Bleeding.  A tear in the intestine.  A reaction to medicines given during the exam.  Infection (rare). What happens before the procedure? Eating and drinking  restrictions  Follow instructions from your health care provider about eating and drinking, which may include:  A few days before the procedure - follow a low-fiber diet. Avoid nuts, seeds, dried fruit, raw fruits, and vegetables.  1-3 days before the procedure - follow a clear liquid diet. Drink only clear liquids, such as clear broth or bouillon, black coffee or tea, clear juice, clear soft drinks or sports drinks,  gelatin desert, and popsicles. Avoid any liquids that contain red or purple dye.  On the day of the procedure - do not eat or drink anything during the 2 hours before the procedure, or within the time period that your health care provider recommends. Bowel prep  If you were prescribed an oral bowel prep to clean out your colon:  Take it as told by your health care provider. Starting the day before your procedure, you will need to drink a large amount of medicated liquid. The liquid will cause you to have multiple loose stools until your stool is almost clear or light green.  If your skin or anus gets irritated from diarrhea, you may use these to relieve the irritation:  Medicated wipes, such as adult wet wipes with aloe and vitamin E.  A skin soothing-product like petroleum jelly.  If you vomit while drinking the bowel prep, take a break for up to 60 minutes and then begin the bowel prep again. If vomiting continues and you cannot take the bowel prep without vomiting, call your health care provider. General instructions  Ask your health care provider about changing or stopping your regular medicines. This is especially important if you are taking diabetes medicines or blood thinners.  Plan to have someone take you home from the hospital or clinic. What happens during the procedure?  An IV tube may be inserted into one of your veins.  You will be given medicine to help you relax (sedative).  To reduce your risk of infection:  Your health care team will wash or sanitize their hands.  Your anal area will be washed with soap.  You will be asked to lie on your side with your knees bent.  Your health care provider will lubricate a long, thin, flexible tube. The tube will have a camera and a light on the end.  The tube will be inserted into your anus.  The tube will be gently eased through your rectum and colon.  Air will be delivered into your colon to keep it open. You may feel  some pressure or cramping.  The camera will be used to take images during the procedure.  A small tissue sample may be removed from your body to be examined under a microscope (biopsy). If any potential problems are found, the tissue will be sent to a lab for testing.  If small polyps are found, your health care provider may remove them and have them checked for cancer cells.  The tube that was inserted into your anus will be slowly removed. The procedure may vary among health care providers and hospitals. What happens after the procedure?  Your blood pressure, heart rate, breathing rate, and blood oxygen level will be monitored until the medicines you were given have worn off.  Do not drive for 24 hours after the exam.  You may have a small amount of blood in your stool.  You may pass gas and have mild abdominal cramping or bloating due to the air that was used to inflate your colon during the exam.  It is up to you to get the results of your procedure. Ask your health care provider, or the department performing the procedure, when your results will be ready. This information is not intended to replace advice given to you by your health care provider. Make sure you discuss any questions you have with your health care provider. Document Released: 07/13/2000 Document Revised: 02/03/2016 Document Reviewed: 09/27/2015 Elsevier Interactive Patient Education  2017 [employer]

## 2016-08-02 ENCOUNTER — Encounter (HOSPITAL_COMMUNITY): Payer: Self-pay

## 2016-08-02 ENCOUNTER — Encounter (HOSPITAL_COMMUNITY)
Admission: RE | Admit: 2016-08-02 | Discharge: 2016-08-02 | Disposition: A | Discharge status: Home / Self Care | Payer: Medicare Other | Source: Ambulatory Visit | Attending: Internal Medicine | Admitting: Internal Medicine

## 2016-08-02 DIAGNOSIS — R9431 Abnormal electrocardiogram [ECG] [EKG]: Secondary | ICD-10-CM | POA: Diagnosis not present

## 2016-08-02 DIAGNOSIS — Z01818 Encounter for other preprocedural examination: Secondary | ICD-10-CM | POA: Insufficient documentation

## 2016-08-02 HISTORY — DX: Other chronic pain: G89.29

## 2016-08-02 HISTORY — DX: Dorsalgia, unspecified: M54.9

## 2016-08-02 HISTORY — DX: Cardiac murmur, unspecified: R01.1

## 2016-08-02 NOTE — Progress Notes (Signed)
Rebecca Arellano, patient's EKG needs to be looked at by her cardiologist TODAY. She is on schedule for her procedure for Monday. Please contact [facility] in [place] to see if cardiologist will look over this and advise.

## 2016-08-02 NOTE — Pre-Procedure Instructions (Signed)
Dr Gonzalez aware of EKG. No orders given. 

## 2016-08-03 NOTE — Progress Notes (Signed)
Patient needs cardiology evaluation for abnormal EKG, sometime within the next week or so.  I spoke with Zada FindersKim Canady in Endo. Documentation under notes tab as well. She discussed EKG findings with Dr. Jayme CloudGonzalez yesterday, he approved proceeding with procedures as scheduled. Discussed with Dr. Jena Gaussourk as well.

## 2016-08-06 ENCOUNTER — Ambulatory Visit (HOSPITAL_COMMUNITY): Payer: Medicare Other | Admitting: Anesthesiology

## 2016-08-06 ENCOUNTER — Encounter (HOSPITAL_COMMUNITY): Payer: Self-pay | Admitting: *Deleted

## 2016-08-06 ENCOUNTER — Ambulatory Visit (HOSPITAL_COMMUNITY)
Admission: RE | Admit: 2016-08-06 | Discharge: 2016-08-06 | Disposition: A | Discharge status: Home / Self Care | Payer: Medicare Other | Source: Ambulatory Visit | Attending: Internal Medicine | Admitting: Internal Medicine

## 2016-08-06 ENCOUNTER — Encounter (HOSPITAL_COMMUNITY)
Admission: RE | Disposition: A | Discharge status: Home / Self Care | Payer: Self-pay | Source: Ambulatory Visit | Attending: Internal Medicine

## 2016-08-06 DIAGNOSIS — K5730 Diverticulosis of large intestine without perforation or abscess without bleeding: Secondary | ICD-10-CM | POA: Insufficient documentation

## 2016-08-06 DIAGNOSIS — Z86010 Personal history of colonic polyps: Secondary | ICD-10-CM | POA: Insufficient documentation

## 2016-08-06 DIAGNOSIS — E119 Type 2 diabetes mellitus without complications: Secondary | ICD-10-CM | POA: Insufficient documentation

## 2016-08-06 DIAGNOSIS — I10 Essential (primary) hypertension: Secondary | ICD-10-CM | POA: Diagnosis not present

## 2016-08-06 DIAGNOSIS — E785 Hyperlipidemia, unspecified: Secondary | ICD-10-CM | POA: Insufficient documentation

## 2016-08-06 DIAGNOSIS — E039 Hypothyroidism, unspecified: Secondary | ICD-10-CM | POA: Insufficient documentation

## 2016-08-06 DIAGNOSIS — K219 Gastro-esophageal reflux disease without esophagitis: Secondary | ICD-10-CM | POA: Insufficient documentation

## 2016-08-06 DIAGNOSIS — Z8379 Family history of other diseases of the digestive system: Secondary | ICD-10-CM | POA: Insufficient documentation

## 2016-08-06 DIAGNOSIS — Z79899 Other long term (current) drug therapy: Secondary | ICD-10-CM | POA: Insufficient documentation

## 2016-08-06 DIAGNOSIS — Z7984 Long term (current) use of oral hypoglycemic drugs: Secondary | ICD-10-CM | POA: Insufficient documentation

## 2016-08-06 DIAGNOSIS — Z87891 Personal history of nicotine dependence: Secondary | ICD-10-CM | POA: Diagnosis not present

## 2016-08-06 DIAGNOSIS — Z1211 Encounter for screening for malignant neoplasm of colon: Secondary | ICD-10-CM | POA: Insufficient documentation

## 2016-08-06 DIAGNOSIS — Z7982 Long term (current) use of aspirin: Secondary | ICD-10-CM | POA: Diagnosis not present

## 2016-08-06 HISTORY — PX: COLONOSCOPY WITH PROPOFOL: SHX5780

## 2016-08-06 LAB — GLUCOSE, CAPILLARY
GLUCOSE-CAPILLARY: 177 mg/dL — AB (ref 65–99)
Glucose-Capillary: 176 mg/dL — ABNORMAL HIGH (ref 65–99)

## 2016-08-06 SURGERY — COLONOSCOPY WITH PROPOFOL
Anesthesia: Monitor Anesthesia Care

## 2016-08-06 MED ORDER — EPHEDRINE SULFATE 50 MG/ML IJ SOLN
INTRAMUSCULAR | Status: AC
  Filled 2016-08-06: qty 1

## 2016-08-06 MED ORDER — PROPOFOL 10 MG/ML IV BOLUS
INTRAVENOUS | Status: AC
  Filled 2016-08-06: qty 40

## 2016-08-06 MED ORDER — LACTATED RINGERS IV SOLN
INTRAVENOUS | Status: DC
  Administered 2016-08-06: 1000 mL via INTRAVENOUS

## 2016-08-06 MED ORDER — MIDAZOLAM HCL 2 MG/2ML IJ SOLN
1.0000 mg | INTRAMUSCULAR | Status: DC | PRN
  Administered 2016-08-06: 2 mg via INTRAVENOUS

## 2016-08-06 MED ORDER — FENTANYL CITRATE (PF) 100 MCG/2ML IJ SOLN
25.0000 ug | INTRAMUSCULAR | Status: AC | PRN
  Administered 2016-08-06 (×2): 25 ug via INTRAVENOUS

## 2016-08-06 MED ORDER — MIDAZOLAM HCL 2 MG/2ML IJ SOLN
INTRAMUSCULAR | Status: AC
  Filled 2016-08-06: qty 2

## 2016-08-06 MED ORDER — FENTANYL CITRATE (PF) 100 MCG/2ML IJ SOLN
INTRAMUSCULAR | Status: AC
  Filled 2016-08-06: qty 2

## 2016-08-06 MED ORDER — SODIUM CHLORIDE 0.9 % IJ SOLN
INTRAMUSCULAR | Status: AC
  Filled 2016-08-06: qty 10

## 2016-08-06 MED ORDER — PROPOFOL 500 MG/50ML IV EMUL
INTRAVENOUS | Status: DC | PRN
  Administered 2016-08-06: 08:00:00 via INTRAVENOUS
  Administered 2016-08-06: 125 ug/kg/min via INTRAVENOUS

## 2016-08-06 MED ORDER — MIDAZOLAM HCL 2 MG/2ML IJ SOLN
INTRAMUSCULAR | Status: AC
Start: 2016-08-06 — End: 2016-08-06
  Filled 2016-08-06: qty 2

## 2016-08-06 MED ORDER — MIDAZOLAM HCL 5 MG/5ML IJ SOLN
INTRAMUSCULAR | Status: DC | PRN
  Administered 2016-08-06: 2 mg via INTRAVENOUS

## 2016-08-06 NOTE — Interval H&P Note (Signed)
History and Physical Interval Note:  08/06/2016 7:25 AM  Rebecca Arellano  has presented today for surgery, with the diagnosis of history of colon polyp  The various methods of treatment have been discussed with the patient and family. After consideration of risks, benefits and other options for treatment, the patient has consented to  Procedure(s) with comments: COLONOSCOPY WITH PROPOFOL (N/A) - 7:30 am as a surgical intervention .  The patient's history has been reviewed, patient examined, no change in status, stable for surgery.  I have reviewed the patient's chart and labs.  Questions were answered to the patient's satisfaction.     No change; surveillance TCS per plan. The risks, benefits, limitations, alternatives and imponderables have been reviewed with the patient. Questions have been answered. All parties are agreeable.  Eula Listenobert Rourk

## 2016-08-06 NOTE — Transfer of Care (Signed)
Immediate Anesthesia Transfer of Care Note  Patient: Rebecca Arellano  Procedure(s) Performed: Procedure(s) with comments: COLONOSCOPY WITH PROPOFOL (N/A) - 7:30 am  Patient Location: PACU  Anesthesia Type:MAC  Level of Consciousness: awake and patient cooperative  Airway & Oxygen Therapy: Patient Spontanous Breathing and Patient connected to face mask oxygen  Post-op Assessment: Report given to RN, Post -op Vital signs reviewed and stable and Patient moving all extremities  Post vital signs: Reviewed and stable  Last Vitals:  Vitals:   08/06/16 0720 08/06/16 0725  BP: (!) 143/86 (!) 161/93  Pulse:    Resp: 20 (!) 26  Temp:      Last Pain:  Vitals:   08/06/16 0642  TempSrc: Oral      Patients Stated Pain Goal: 8 (78/29/56 2130)  Complications: No apparent anesthesia complications

## 2016-08-06 NOTE — Discharge Instructions (Signed)
Diverticulosis Diverticulosis is the condition that develops when small pouches (diverticula) form in the wall of your colon. Your colon, or large intestine, is where water is absorbed and stool is formed. The pouches form when the inside layer of your colon pushes through weak spots in the outer layers of your colon. CAUSES  No one knows exactly what causes diverticulosis. RISK FACTORS  Being older than 50. Your risk for this condition increases with age. Diverticulosis is rare in people younger than 40 years. By age 70, almost everyone has it.  Eating a low-fiber diet.  Being frequently constipated.  Being overweight.  Not getting enough exercise.  Smoking.  Taking over-the-counter pain medicines, like aspirin and ibuprofen. SYMPTOMS  Most people with diverticulosis do not have symptoms. DIAGNOSIS  Because diverticulosis often has no symptoms, health care providers often discover the condition during an exam for other colon problems. In many cases, a health care provider will diagnose diverticulosis while using a flexible scope to examine the colon (colonoscopy). TREATMENT  If you have never developed an infection related to diverticulosis, you may not need treatment. If you have had an infection before, treatment may include:  Eating more fruits, vegetables, and grains.  Taking a fiber supplement.  Taking a live bacteria supplement (probiotic).  Taking medicine to relax your colon. HOME CARE INSTRUCTIONS   Drink at least 6-8 glasses of water each day to prevent constipation.  Try not to strain when you have a bowel movement.  Keep all follow-up appointments. If you have had an infection before:  Increase the fiber in your diet as directed by your health care provider or dietitian.  Take a dietary fiber supplement if your health care provider approves.  Only take medicines as directed by your health care provider. SEEK MEDICAL CARE IF:   You have abdominal  pain.  You have bloating.  You have cramps.  You have not gone to the bathroom in 3 days. SEEK IMMEDIATE MEDICAL CARE IF:   Your pain gets worse.  Yourbloating becomes very bad.  You have a fever or chills, and your symptoms suddenly get worse.  You begin vomiting.  You have bowel movements that are bloody or black. MAKE SURE YOU:  Understand these instructions.  Will watch your condition.  Will get help right away if you are not doing well or get worse. This information is not intended to replace advice given to you by your health care provider. Make sure you discuss any questions you have with your health care provider. Document Released: 04/12/2004 Document Revised: 07/21/2013 Document Reviewed: 06/10/2013 Elsevier Interactive Patient Education  2017 [employer] Constipation, Adult Constipation is when a person has fewer bowel movements in a week than normal, has difficulty having a bowel movement, or has stools that are dry, hard, or larger than normal. Constipation may be caused by an underlying condition. It may become worse with age if a person takes certain medicines and does not take in enough fluids. Follow these instructions at home: Eating and drinking Eat foods that have a lot of fiber, such as fresh fruits and vegetables, whole grains, and beans. Limit foods that are high in fat, low in fiber, or overly processed, such as french fries, hamburgers, cookies, candies, and soda. Drink enough fluid to keep your urine clear or pale yellow. General instructions Exercise regularly or as told by your health care provider. Go to the restroom when you have the urge to go. Do not hold it in.  Take over-the-counter and prescription medicines only as told by your health care provider. These include any fiber supplements. Practice pelvic floor retraining exercises, such as deep breathing while relaxing the lower abdomen and pelvic floor relaxation during bowel  movements. Watch your condition for any changes. Keep all follow-up visits as told by your health care provider. This is important. Contact a health care provider if: You have pain that gets worse. You have a fever. You do not have a bowel movement after 4 days. You vomit. You are not hungry. You lose weight. You are bleeding from the anus. You have thin, pencil-like stools. Get help right away if: You have a fever and your symptoms suddenly get worse. You leak stool or have blood in your stool. Your abdomen is bloated. You have severe pain in your abdomen. You feel dizzy or you faint. This information is not intended to replace advice given to you by your health care provider. Make sure you discuss any questions you have with your health care provider. Document Released: 04/13/2004 Document Revised: 02/03/2016 Document Reviewed: 01/04/2016 Elsevier Interactive Patient Education  2017 [employer] Inc.  Colonoscopy Discharge Instructions  Read the instructions outlined below and refer to this sheet in the next few weeks. These discharge instructions provide you with general information on caring for yourself after you leave the hospital. Your doctor may also give you specific instructions. While your treatment has been planned according to the most current medical practices available, unavoidable complications occasionally occur. If you have any problems or questions after discharge, call Dr. Jena Gauss at [phone]. ACTIVITY  You may resume your regular activity, but move at a slower pace for the next 24 hours.   Take frequent rest periods for the next 24 hours.   Walking will help get rid of the air and reduce the bloated feeling in your belly (abdomen).   No driving for 24 hours (because of the medicine (anesthesia) used during the test).    Do not sign any important legal documents or operate any machinery for 24 hours (because of the anesthesia used during the test).   NUTRITION  Drink plenty of fluids.   You may resume your normal diet as instructed by your doctor.   Begin with a light meal and progress to your normal diet. Heavy or fried foods are harder to digest and may make you feel sick to your stomach (nauseated).   Avoid alcoholic beverages for 24 hours or as instructed.  MEDICATIONS  You may resume your normal medications unless your doctor tells you otherwise.  WHAT YOU CAN EXPECT TODAY  Some feelings of bloating in the abdomen.   Passage of more gas than usual.   Spotting of blood in your stool or on the toilet paper.  IF YOU HAD POLYPS REMOVED DURING THE COLONOSCOPY:  No aspirin products for 7 days or as instructed.   No alcohol for 7 days or as instructed.   Eat a soft diet for the next 24 hours.  FINDING OUT THE RESULTS OF YOUR TEST Not all test results are available during your visit. If your test results are not back during the visit, make an appointment with your caregiver to find out the results. Do not assume everything is normal if you have not heard from your caregiver or the medical facility. It is important for you to follow up on all of your test results.  SEEK IMMEDIATE MEDICAL ATTENTION IF:  You have more than a spotting of blood in your  stool.   Your belly is swollen (abdominal distention).   You are nauseated or vomiting.   You have a temperature over 101.   You have abdominal pain or discomfort that is severe or gets worse throughout the day.     Constipation and colon diverticulosis information provided  Repeat colonoscopy in 5 years  Continue gummy fiber supplement  Begin Linzess 72 daily as previously directed  Office visit with [place] in 3 months

## 2016-08-06 NOTE — Anesthesia Preprocedure Evaluation (Signed)
Anesthesia Evaluation  Patient identified by MRN, date of birth, ID band Patient awake    Reviewed: Allergy & Precautions, NPO status , Patient's Chart, lab work & pertinent test results  Airway Mallampati: II  TM Distance: >3 FB     Dental  (+) Poor Dentition   Pulmonary asthma ,    breath sounds clear to auscultation       Cardiovascular hypertension, Pt. on medications  Rhythm:Regular Rate:Normal     Neuro/Psych    GI/Hepatic GERD  Medicated,  Endo/Other  diabetes, Type 2, Oral Hypoglycemic AgentsHypothyroidism   Renal/GU      Musculoskeletal   Abdominal   Peds  Hematology   Anesthesia Other Findings   Reproductive/Obstetrics                             Anesthesia Physical Anesthesia Plan  ASA: III  Anesthesia Plan: MAC   Post-op Pain Management:    Induction: Intravenous  Airway Management Planned: Simple Face Mask  Additional Equipment:   Intra-op Plan:   Post-operative Plan:   Informed Consent: I have reviewed the patients History and Physical, chart, labs and discussed the procedure including the risks, benefits and alternatives for the proposed anesthesia with the patient or authorized representative who has indicated his/her understanding and acceptance.     Plan Discussed with:   Anesthesia Plan Comments:         Anesthesia Quick Evaluation

## 2016-08-06 NOTE — Op Note (Signed)
[facility] Patient Name: Rebecca Arellano Procedure Date: 08/06/2016 7:13 AM MRN: [id number] Date of Birth: 11-Jun-1946 Attending MD: Gennette Pac , MD CSN: [id number] Age: 71 Admit Type: Outpatient Procedure:                Colonoscopy Indications:              High risk colon cancer surveillance: Personal                            history of colonic polyps Providers:                Gennette Pac, MD, Nena Polio, RN, Dyann Ruddle, Burke Keels, Technician Referring MD:              Medicines:                Propofol per Anesthesia Complications:            No immediate complications. Estimated Blood Loss:     Estimated blood loss: none. Procedure:                Pre-Anesthesia Assessment:                           - Prior to the procedure, a History and Physical                            was performed, and patient medications and                            allergies were reviewed. The patient's tolerance of                            previous anesthesia was also reviewed. The risks                            and benefits of the procedure and the sedation                            options and risks were discussed with the patient.                            All questions were answered, and informed consent                            was obtained. Prior Anticoagulants: The patient has                            taken no previous anticoagulant or antiplatelet                            agents. ASA Grade Assessment: II - A patient with  mild systemic disease. After reviewing the risks                            and benefits, the patient was deemed in                            satisfactory condition to undergo the procedure.                           After obtaining informed consent, the colonoscope                            was passed under direct vision. Throughout the                            procedure,  the patient's blood pressure, pulse, and                            oxygen saturations were monitored continuously. The                            EC-[id number] ([id number]) scope was introduced through                            the and advanced to the the cecum, identified by                            appendiceal orifice and ileocecal valve. The                            colonoscopy was performed without difficulty. The                            patient tolerated the procedure well. The quality                            of the bowel preparation was adequate. The                            ileocecal valve, appendiceal orifice, and rectum                            were photographed. The entire colon was well                            visualized. The patient tolerated the procedure                            well. The quality of the bowel preparation was                            adequate. Scope In: 7:38:51 AM Scope Out: 7:57:50 AM Scope Withdrawal Time: 0 hours 9 minutes 54 seconds  Total Procedure Duration: 0 hours 18 minutes 59  seconds  Findings:      The perianal and digital rectal examinations were normal.      Scattered small and large-mouthed diverticula were found in the sigmoid       colon and descending colon.      The exam was otherwise without abnormality on direct and retroflexion       views. Impression:               - Diverticulosis in the sigmoid colon and in the                            descending colon.                           - The examination was otherwise normal on direct                            and retroflexion views. Redundant colon                           - No specimens collected. Moderate Sedation:      Moderate (conscious) sedation was personally administered by an       anesthesia professional. The following parameters were monitored: oxygen       saturation, heart rate, blood pressure, respiratory rate, EKG, adequacy       of pulmonary ventilation,  and response to care. Total physician       intraservice time was 27 minutes. Recommendation:           - Patient has a contact number available for                            emergencies. The signs and symptoms of potential                            delayed complications were discussed with the                            patient. Return to normal activities tomorrow.                            Written discharge instructions were provided to the                            patient.                           - Resume previous diet.                           - Continue present medications. Continue gummy                            fiber; Begin LINZESS 72 daily as previously                            instructed                           -  Repeat colonoscopy in 5 years for surveillance.                           - Return to GI office in 3 months. Procedure Code(s):        --- Professional ---                           [phone], Colonoscopy, flexible; diagnostic, including                            collection of specimen(s) by brushing or washing,                            when performed (separate procedure) Diagnosis Code(s):        --- Professional ---                           Z86.010, Personal history of colonic polyps                           K57.30, Diverticulosis of large intestine without                            perforation or abscess without bleeding CPT copyright 2016 American Medical Association. All rights reserved. The codes documented in this report are preliminary and upon coder review may  be revised to meet current compliance requirements. Gerrit Friendsobert M. Manly Nestle, MD Gennette Pacobert Michael Micael Barb, MD 08/06/2016 8:07:07 AM This report has been signed electronically. Number of Addenda: 0

## 2016-08-06 NOTE — H&P (View-Only) (Signed)
Primary Care Physician:  Milana ObeyStephen D Knowlton, MD  Primary Gastroenterologist:  Roetta SessionsMichael Rourk, MD   Chief Complaint  Patient presents with  . Colonoscopy    HPI:  Rebecca Arellano is a 71 y.o. female here for further evaluation of abdominal pain, bowel concerns, to schedule her overdue colonoscopy. 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 Exam was incomplete on several attempts requiring barium enemas. [place]. She had a colonoscopy in 2011 by Dr. Jena Gaussourk. Patient was found to have a long redundant colon requiring number of maneuvers including external abdominal pressure to get to the syncope. Completed with adult colonoscope. She had left-sided diverticula but no polyps. Recommended repeat surveillance colonoscopy in 5 years. Patient has been putting this off.   Now that she's having more trouble with her stomach and bowel issues she decided to pursue colonoscopy. Couple months ago she was treated for diverticulitis with Cipro and Flagyl. She keeps antibiotics on hand. States had been on long time prior to that episode that she required antibiotics. She's never been hospitalized with it.   She complains of intermittent left lower quadrant pain. Sometimes better after bowel movement. No persistent pain recently. No dysuria. She's also had some left upper quadrant pain to is improved with BM. She's concerned because her brother has a history of idiopathic pancreatitis. She also has a brother with celiac disease. He went undiagnosed for years. "He almost died from it". She denies melena rectal bleeding. Complains of postprandial abdominal cramping at times sometimes with and without bowel movements. No upper GI symptoms. No unintentional weight loss.      Current Outpatient Prescriptions  Medication Sig Dispense Refill  . amLODipine (NORVASC) 10 MG tablet Take 1 tablet by mouth daily.    Marland Kitchen. aspirin 325 MG tablet Take 325 mg  by mouth daily.    Marland Kitchen. atorvastatin (LIPITOR) 80 MG tablet Take 1 tablet by mouth daily.    Marland Kitchen. glimepiride (AMARYL) 4 MG tablet Take 0.5-1 tablets by mouth See admin instructions. 1/2 tab daily = BS<70 and 1 tab daily = BS>70.    . levothyroxine (SYNTHROID, LEVOTHROID) 100 MCG tablet Take 1 tablet by mouth daily.    Marland Kitchen. lisinopril (PRINIVIL,ZESTRIL) 20 MG tablet Take 1 tablet (20 mg total) by mouth daily. 30 tablet 6  . metFORMIN (GLUCOPHAGE) 1000 MG tablet Take 1 tablet by mouth 2 (two) times daily.    . Probiotic Product (PROBIOTIC DAILY PO) Take by mouth.    . traMADol (ULTRAM) 50 MG tablet      No current facility-administered medications for this visit.     Allergies as of 07/09/2016  . (No Known Allergies)    Past Medical History:  Diagnosis Date  . Abnormal EKG   . Allergic rhinitis   . Asthma   . DDD (degenerative disc disease), lumbar   . Degenerative lumbar disc   . Diabetes mellitus, type II (HCC)   . Goiter   . Hypertension   . Hypothyroidism   . Other and unspecified hyperlipidemia   . Reflux     Past Surgical History:  Procedure Laterality Date  . 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  . COLONOSCOPY  02/2010   Dr. Jena Gaussourk, left-sided diverticula, surveillance colonoscopy recommended for 5 years    Family History  Problem Relation Age of Onset  . Colitis Mother   . Pancreatitis Brother     No etoh.  idiopathic per patient.   . Celiac disease Brother   . Irritable bowel syndrome Brother   . Colon cancer Neg Hx   . Inflammatory bowel disease Neg Hx     Social History   Social History  . Marital status: Single    Spouse name: N/A  . Number of children: N/A  . Years of education: N/A   Occupational History  . Not on file.   Social History Main Topics  . Smoking status: Former Games developermoker  . Smokeless tobacco: Never Used  . Alcohol use No  . Drug use: No  . Sexual activity: Not on file   Other Topics Concern  . Not  on file   Social History Narrative  . No narrative on file      ROS:  General: Negative for anorexia, weight loss, fever, chills, fatigue, weakness. Eyes: Negative for vision changes.  ENT: Negative for hoarseness, difficulty swallowing , nasal congestion. CV: Negative for chest pain, angina, palpitations, dyspnea on exertion, peripheral edema.  Respiratory: Negative for dyspnea at rest, dyspnea on exertion, cough, sputum, wheezing.  GI: See history of present illness. GU:  Negative for dysuria, hematuria, urinary incontinence, urinary frequency, nocturnal urination.  MS: Negative for joint pain. Positive low back pain.  Derm: Negative for rash or itching.  Neuro: Negative for weakness, abnormal sensation, seizure, frequent headaches, memory loss, confusion.  Psych: Negative for anxiety, depression, suicidal ideation, hallucinations.  Endo: Negative for unusual weight change.  Heme: Negative for bruising or bleeding. Allergy: Negative for rash or hives.    Physical Examination:  BP 136/84   Pulse 85   Temp 97.4 F (36.3 C) (Oral)   Ht 5\' 3"  (1.6 m)   Wt 166 lb (75.3 kg)   BMI 29.41 kg/m    General: Well-nourished, well-developed in no acute distress.  Head: Normocephalic, atraumatic.   Eyes: Conjunctiva pink, no icterus. Mouth: Oropharyngeal mucosa moist and pink , no lesions erythema or exudate. Neck: Supple without thyromegaly, masses, or lymphadenopathy.  Lungs: Clear to auscultation bilaterally.  Heart: Regular rate and rhythm, no murmurs rubs or gallops.  Abdomen: Bowel sounds are normal, nontender, nondistended, no hepatosplenomegaly or masses, no abdominal bruits or    hernia , no rebound or guarding.   Rectal: Deferred Extremities: No lower extremity edema. No clubbing or deformities.  Neuro: Alert and oriented x 4 , grossly normal neurologically.  Skin: Warm and dry, no rash or jaundice.   Psych: Alert and cooperative, normal mood and affect.    Imaging  Studies: No results found.

## 2016-08-06 NOTE — Anesthesia Postprocedure Evaluation (Signed)
Anesthesia Post Note  Patient: Rebecca Arellano  Procedure(s) Performed: Procedure(s) (LRB): COLONOSCOPY WITH PROPOFOL (N/A)  Patient location during evaluation: PACU Anesthesia Type: MAC Level of consciousness: awake and alert and patient cooperative Pain management: pain level controlled Vital Signs Assessment: post-procedure vital signs reviewed and stable Respiratory status: spontaneous breathing, nonlabored ventilation and respiratory function stable Cardiovascular status: blood pressure returned to baseline Postop Assessment: no signs of nausea or vomiting Anesthetic complications: no     Last Vitals:  Vitals:   08/06/16 0720 08/06/16 0725  BP: (!) 143/86 (!) 161/93  Pulse:    Resp: 20 (!) 26  Temp:      Last Pain:  Vitals:   08/06/16 0642  TempSrc: Oral                 Tyrika Newman J

## 2016-08-07 ENCOUNTER — Encounter (HOSPITAL_COMMUNITY): Payer: Self-pay | Admitting: Internal Medicine

## 2016-08-13 ENCOUNTER — Telehealth: Payer: Self-pay | Admitting: Internal Medicine

## 2016-08-13 NOTE — Telephone Encounter (Signed)
Pt called asking to speak with LSL. She said that she had a procedure recently and was told that her EKG was abnormal and had questions about that and wanted to speak with LSL or the nurse. Please call [phone]

## 2016-08-13 NOTE — Telephone Encounter (Signed)
I spoke with the pt, answered all her questions. She is aware that she needs to see cardiology regarding the abnormal EKG that she had done in preop. She said she will call them (cardiology)  and if she needs a referral, she will call us back.

## 2016-08-14 ENCOUNTER — Telehealth: Payer: Self-pay | Admitting: Internal Medicine

## 2016-08-14 ENCOUNTER — Other Ambulatory Visit: Payer: Self-pay

## 2016-08-14 DIAGNOSIS — R9431 Abnormal electrocardiogram [ECG] [EKG]: Secondary | ICD-10-CM

## 2016-08-14 NOTE — Telephone Encounter (Signed)
Pt spoke with JL yesterday about her abnormal EKG that was done in pre-op and said that she called cardiology and was told she would need a referral from us.   Telephone Encounter Encounter Date: 08/13/2016 4:35 PM Myra RudeJulie H Lawson, LPN    [] Hide copied text [] Hover for attribution information I spoke with the pt, answered all her questions. She is aware that she needs to see cardiology regarding the abnormal EKG that she had done in preop. She said she will call them (cardiology)  and if she needs a referral, she will call us back.     Electronically signed by Myra RudeJulie H Lawson, LPN at 6/65/99351/15/2018 4:37 PM

## 2016-08-14 NOTE — Telephone Encounter (Signed)
Referral has been made.

## 2016-09-24 ENCOUNTER — Encounter: Payer: Self-pay | Admitting: Cardiovascular Disease

## 2016-09-24 ENCOUNTER — Ambulatory Visit (INDEPENDENT_AMBULATORY_CARE_PROVIDER_SITE_OTHER): Payer: Medicare Other | Admitting: Cardiovascular Disease

## 2016-09-24 VITALS — BP 136/82 | HR 99 | Ht 62.0 in | Wt 165.0 lb

## 2016-09-24 DIAGNOSIS — R011 Cardiac murmur, unspecified: Secondary | ICD-10-CM

## 2016-09-24 NOTE — Progress Notes (Signed)
CARDIOLOGY CONSULT NOTE  Patient ID: Rebecca Arellano MRN: [id number] DOB/AGE: 70-Jun-1947 70 y.o.  Admit date: (Not on file) Primary Physician: Milana Obey, MD Referring Physician:   Reason for Consultation: abnormal ECG  HPI: The patient is a 71 year old woman with a history of hypertension, hyperlipidemia, diabetes, and hypothyroidism who is referred today for the evaluation of an abnormal ECG. This was performed prior to undergoing colonoscopy.  ECG performed on 08/02/16 showed sinus rhythm with PVCs, undulating artifact, nonspecific ST segment and T-wave abnormalities, and long corrected QT interval of 555 ms.  A repeat ECG performed today shows sinus rhythm with PVCs and diffuse nonspecific T wave abnormalities.  I evaluated her on 08/03/13 for hypertensive heart disease.  Echocardiogram on 05/29/13 showed normal left ventricular systolic function, LVEF 55-60%, mild LVH, grade 1 diastolic dysfunction, right ventricular hypertrophy, and mild left atrial enlargement.  She denies chest pain. She has shortness of breath if she "overdoes it" with activities around the house. These symptoms have not gotten any worse. While she denies smoking her husband has smoked for several decades. She has a history of asthma. She has occasional palpitations.    No Known Allergies  Current Outpatient Prescriptions  Medication Sig Dispense Refill  . amLODipine (NORVASC) 10 MG tablet Take 1 tablet by mouth daily.    Marland Kitchen aspirin 325 MG tablet Take 325 mg by mouth daily.    Marland Kitchen atorvastatin (LIPITOR) 80 MG tablet Take 1 tablet by mouth daily.    Marland Kitchen FIBER ADULT GUMMIES PO Take 1 tablet by mouth daily.    Marland Kitchen glimepiride (AMARYL) 4 MG tablet Take 4 mg by mouth daily with breakfast.     . ibuprofen (ADVIL,MOTRIN) 200 MG tablet Take 400 mg by mouth 2 (two) times daily.    Marland Kitchen levothyroxine (SYNTHROID, LEVOTHROID) 100 MCG tablet Take 1 tablet by mouth daily.    Marland Kitchen lisinopril (PRINIVIL,ZESTRIL) 20 MG  tablet Take 1 tablet (20 mg total) by mouth daily. 30 tablet 6  . metFORMIN (GLUCOPHAGE) 1000 MG tablet Take 1 tablet by mouth 2 (two) times daily.    . Probiotic Product (PROBIOTIC DAILY PO) Take 1 tablet by mouth daily.     . traMADol (ULTRAM) 50 MG tablet Take 50 mg by mouth daily as needed for moderate pain.      No current facility-administered medications for this visit.     Past Medical History:  Diagnosis Date  . Abnormal EKG   . Allergic rhinitis   . Asthma   . Chronic back pain   . DDD (degenerative disc disease), lumbar   . Degenerative lumbar disc   . Diabetes mellitus, type II (HCC)   . Goiter   . Heart murmur   . Hypertension   . Hypothyroidism   . Other and unspecified hyperlipidemia   . Reflux     Past Surgical History:  Procedure Laterality Date  . 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  . COLONOSCOPY  02/2010   Dr. Jena Gauss, left-sided diverticula, surveillance colonoscopy recommended for 5 years  . COLONOSCOPY WITH PROPOFOL N/A 08/06/2016   Procedure: COLONOSCOPY WITH PROPOFOL;  Surgeon: Corbin Ade, MD;  Location: [facility];  Service: Endoscopy;  Laterality: N/A;  7:30 am    Social History   Social History  . Marital status: Single    Spouse name: N/A  . Number of children: N/A  . Years of education: N/A   Occupational History  .  Not on file.   Social History Main Topics  . Smoking status: Never Smoker  . Smokeless tobacco: Never Used  . Alcohol use No  . Drug use: No  . Sexual activity: Yes    Birth control/ protection: None   Other Topics Concern  . Not on file   Social History Narrative  . No narrative on file     No family history of premature CAD in 1st degree relatives.  Prior to Admission medications   Medication Sig Start Date End Date Taking? Authorizing Provider  amLODipine (NORVASC) 10 MG tablet Take 1 tablet by mouth daily. 05/19/13   Historical Provider, MD  aspirin 325 MG tablet  Take 325 mg by mouth daily.    Historical Provider, MD  atorvastatin (LIPITOR) 80 MG tablet Take 1 tablet by mouth daily. 06/04/16   Historical Provider, MD  FIBER ADULT GUMMIES PO Take 1 tablet by mouth daily.    Historical Provider, MD  glimepiride (AMARYL) 4 MG tablet Take 4 mg by mouth daily with breakfast.  04/15/13   Historical Provider, MD  ibuprofen (ADVIL,MOTRIN) 200 MG tablet Take 400 mg by mouth 2 (two) times daily.    Historical Provider, MD  levothyroxine (SYNTHROID, LEVOTHROID) 100 MCG tablet Take 1 tablet by mouth daily. 02/04/15   Historical Provider, MD  lisinopril (PRINIVIL,ZESTRIL) 20 MG tablet Take 1 tablet (20 mg total) by mouth daily. 03/29/14   Laqueta Linden, MD  metFORMIN (GLUCOPHAGE) 1000 MG tablet Take 1 tablet by mouth 2 (two) times daily. 04/15/13   Historical Provider, MD  polyethylene glycol-electrolytes (TRILYTE) 420 g solution Take 4,000 mLs by mouth as directed. 07/09/16   Corbin Ade, MD  Probiotic Product (PROBIOTIC DAILY PO) Take 1 tablet by mouth daily.     Historical Provider, MD  traMADol (ULTRAM) 50 MG tablet Take 50 mg by mouth daily as needed for moderate pain.  06/04/16   Historical Provider, MD     Review of systems complete and found to be negative unless listed above in HPI     Physical exam Blood pressure 136/82, pulse 99, height 5\' 2"  (1.575 m), weight 165 lb (74.8 kg), SpO2 98 %. General: NAD Neck: No JVD, no thyromegaly or thyroid nodule.  Lungs: Clear to auscultation bilaterally with normal respiratory effort. CV: Nondisplaced PMI. Regular rate and rhythm, normal S1/S2, no S3/S4, no murmur.  No peripheral edema.  No carotid bruit.  Abdomen: Soft, nontender, no hepatosplenomegaly, no distention.  Skin: Intact without lesions or rashes.  Neurologic: Alert and oriented x 3.  Psych: Normal affect. Extremities: No clubbing or cyanosis.  HEENT: Normal.   ECG: Most recent ECG reviewed.  Telemetry: Independently reviewed.  Labs:   Lab  Results  Component Value Date   WBC 9.3 07/09/2016   HGB 12.6 07/09/2016   HCT 39.3 07/09/2016   MCV 94.0 07/09/2016   PLT 341 07/09/2016   No results for input(s): NA, K, CL, CO2, BUN, CREATININE, CALCIUM, PROT, BILITOT, ALKPHOS, ALT, AST, GLUCOSE in the last 168 hours.  Invalid input(s): LABALBU No results found for: CKTOTAL, CKMB, CKMBINDEX, TROPONINI No results found for: CHOL No results found for: HDL No results found for: LDLCALC No results found for: TRIG No results found for: CHOLHDL No results found for: LDLDIRECT       Studies: No results found.  ASSESSMENT AND PLAN:  1. Abnormal ECG: She is essentially asymptomatic with occasional palpitations and shortness of breath with moderate exertion. At this time, no cardiovascular  testing is warranted. Should she develop chest pain, marked fatigue, or marked worsening of exertional dyspnea in the absence of other etiologies, one could consider stress testing in the future.  2. Hypertension: Controlled. No changes.  Dispo: fu prn.   Signed: Prentice DockerSuresh Koneswaran, M.D., F.A.C.C.  09/24/2016, 8:57 AM

## 2016-09-24 NOTE — Patient Instructions (Signed)
Your physician recommends that you schedule a follow-up appointment in: as needed     Your physician recommends that you continue on your current medications as directed. Please refer to the Current Medication list given to you today.   Thank you for choosing  [facility] !         

## 2016-11-07 ENCOUNTER — Encounter: Payer: Self-pay | Admitting: Gastroenterology

## 2016-11-07 ENCOUNTER — Ambulatory Visit (INDEPENDENT_AMBULATORY_CARE_PROVIDER_SITE_OTHER): Payer: Medicare Other | Admitting: Gastroenterology

## 2016-11-07 VITALS — BP 123/70 | HR 73 | Temp 98.1°F | Ht 62.0 in | Wt 167.0 lb

## 2016-11-07 DIAGNOSIS — K5904 Chronic idiopathic constipation: Secondary | ICD-10-CM | POA: Diagnosis not present

## 2016-11-07 DIAGNOSIS — Z86010 Personal history of colonic polyps: Secondary | ICD-10-CM | POA: Diagnosis not present

## 2016-11-07 NOTE — Assessment & Plan Note (Signed)
Doing well with increased dietary fiber and fiber supplement. Samples of Linzess to have for on demand use. Discussed that can be used daily if needed and meant to be taken daily. As long as she can control with current regimen, she wants to continue dietary measures.

## 2016-11-07 NOTE — Progress Notes (Signed)
Primary Care Physician:  Milana Obey, MD  Primary Gastroenterologist:  Roetta Sessions, MD   Chief Complaint  Patient presents with  . pp f/u    tcs, doing ok    HPI:  Rebecca Arellano is a 71 y.o. female here to for follow up of surveillance colonoscopy for h/o colon polyps. Colonoscopy with diverticulosis only in 07/2016. Next TCS in five years.  She had abnormal EKG at pre-op. She was seen by Dr. Purvis Sheffield, cardiology. No further cardiac work up recommended.    Treated empirically for diverticulitis in the fall of 2017. Brother with idiopathic pancreatitis. Brother with celiac disease.    Changed diet. BF eats raisin brain. Eating more fruit and salad. Fiber supplements. BM are soft now. Took samples of Linzess before and helped. Wants to have some samples for prn use. No abd pain. Occasional nocturnal heartburn but admits to eating late at night. Was on Ibuprofen  tid before. Now taking between 1-2 in am. Tramadol before bed. Abdominal pain much better since making this change. No melena, brbpr. No dysphagia. No unintentional weight loss.     Current Outpatient Prescriptions  Medication Sig Dispense Refill  . amLODipine (NORVASC) 10 MG tablet Take 1 tablet by mouth daily.    Marland Kitchen aspirin 325 MG tablet Take 325 mg by mouth daily.    Marland Kitchen atorvastatin (LIPITOR) 80 MG tablet Take 1 tablet by mouth daily.    Marland Kitchen FIBER ADULT GUMMIES PO Take 1 tablet by mouth daily.    Marland Kitchen glimepiride (AMARYL) 4 MG tablet Take 4 mg by mouth daily with breakfast.     . ibuprofen (ADVIL,MOTRIN) 200 MG tablet Take by mouth daily. Takes 1-3 by mouth in mornings    . levothyroxine (SYNTHROID, LEVOTHROID) 100 MCG tablet Take 1 tablet by mouth daily.    Marland Kitchen lisinopril (PRINIVIL,ZESTRIL) 20 MG tablet Take 1 tablet (20 mg total) by mouth daily. 30 tablet 6  . metFORMIN (GLUCOPHAGE) 1000 MG tablet Take 1 tablet by mouth 2 (two) times daily.    . Probiotic Product (PROBIOTIC DAILY PO) Take 1 tablet by mouth  daily.     . traMADol (ULTRAM) 50 MG tablet Take 50 mg by mouth daily as needed for moderate pain.      No current facility-administered medications for this visit.     Allergies as of 11/07/2016  . (No Known Allergies)     ROS:  General: Negative for anorexia, weight loss, fever, chills, fatigue, weakness. ENT: Negative for hoarseness, difficulty swallowing , nasal congestion. CV: Negative for chest pain, angina, palpitations, dyspnea on exertion, peripheral edema.  Respiratory: Negative for dyspnea at rest, dyspnea on exertion, cough, sputum, wheezing.  GI: See history of present illness. GU:  Negative for dysuria, hematuria, urinary incontinence, urinary frequency, nocturnal urination.  Endo: Negative for unusual weight change.        Physical Examination:  BP 123/70   Pulse 73   Temp 98.1 F (36.7 C) (Oral)   Ht  (1.575 m)   Wt 167 lb (75.8 kg)   BMI 30.54 kg/m     Physical Examination:  General: Well-nourished, well-developed in no acute distress.  Eyes: No icterus. Mouth: Oropharyngeal mucosa moist and pink , no lesions erythema or exudate. Lungs: Clear to auscultation bilaterally.  Heart: Regular rate and rhythm, no murmurs rubs or gallops.  Abdomen: Bowel sounds are normal, nontender, nondistended, no hepatosplenomegaly or masses, no abdominal bruits or hernia , no rebound or guarding.   Extremities:  No lower extremity edema.  Neuro: Alert and oriented x 4   Skin: Warm and dry, no jaundice.   Psych: Alert and cooperative, normal mood and affect.   Labs: Lab Results  Component Value Date   ALT 20 07/09/2016   AST 20 07/09/2016   ALKPHOS 79 07/09/2016   BILITOT 0.6 07/09/2016   Lab Results  Component Value Date   CREATININE 0.84 07/09/2016   BUN 17 07/09/2016   NA 139 07/09/2016   K 3.9 07/09/2016   CL 102 07/09/2016   CO2 23 07/09/2016   Lab Results  Component Value Date   WBC 9.3 07/09/2016   HGB 12.6 07/09/2016   HCT 39.3 07/09/2016    MCV 94.0 07/09/2016   PLT 341 07/09/2016   TTG, IGA normal. IGA normal.  Lab Results  Component Value Date   LIPASE 50 07/09/2016     Imaging Studies: No results found.

## 2016-11-07 NOTE — Patient Instructions (Signed)
1. Colonoscopy in five years.  2. Continue high fiber diet and fiber supplement. You may use Linzess as needed for constipation. Samples provided today.  3. Return to the office in you have any problems. Call with any questions.

## 2016-11-07 NOTE — Assessment & Plan Note (Signed)
Next colonoscopy in 07/2021.

## 2016-11-07 NOTE — Progress Notes (Signed)
CC'ED TO PCP 

## 2016-12-24 ENCOUNTER — Telehealth: Payer: Self-pay | Admitting: Gastroenterology

## 2016-12-24 NOTE — Telephone Encounter (Signed)
-----   Message from Corbin Adeobert M Rourk, MD sent at 11/08/2016 10:14 AM EDT ----- Looks like an incidentaloma;   As discussed, would follow clinically. If she returns with the abdominal pain would "circle back around. Thanks. ----- Message ----- From: Tiffany KocherLeslie S Kamani Lewter, PA-C Sent: 11/07/2016  11:36 AM To: Corbin Adeobert M Rourk, MD, Tiffany KocherLeslie S Jaxtyn Linville, PA-C  Can you tell me if you would do any additional imaging of pancreas or liver based on this report? We did not order the study, I just saw it when patient was in for follow up.   MRI ABDOMEN WITHOUT AND WITH CONTRAST  TECHNIQUE: Multiplanar multisequence MR imaging of the abdomen was performed both before and after the administration of intravenous contrast.  CONTRAST:  15mL MULTIHANCE GADOBENATE DIMEGLUMINE 529 MG/ML IV SOLN  COMPARISON:  03/19/2014 lumbar spine MRI.  FINDINGS: Portions of exam are mildly motion degraded. Tiny right hepatic lobe cyst. Hepatic steatosis and hepatomegaly. 20 cm craniocaudal. Normal spleen, stomach. Possible focus of side branch duct ectasia within the pancreatic head. 6 mm on image 21 of series 4. Of doubtful clinical significance.

## 2017-06-07 ENCOUNTER — Other Ambulatory Visit: Payer: Self-pay | Admitting: Family Medicine

## 2017-06-07 DIAGNOSIS — M545 Low back pain: Principal | ICD-10-CM

## 2017-06-07 DIAGNOSIS — G8929 Other chronic pain: Secondary | ICD-10-CM

## 2017-07-10 ENCOUNTER — Other Ambulatory Visit: Payer: Medicare Other

## 2017-07-18 ENCOUNTER — Ambulatory Visit
Admission: RE | Admit: 2017-07-18 | Discharge: 2017-07-18 | Disposition: A | Discharge status: Home / Self Care | Payer: Medicare Other | Source: Ambulatory Visit | Attending: Family Medicine | Admitting: Family Medicine

## 2017-07-18 DIAGNOSIS — M545 Low back pain: Principal | ICD-10-CM

## 2017-07-18 DIAGNOSIS — G8929 Other chronic pain: Secondary | ICD-10-CM

## 2017-07-18 MED ORDER — METHYLPREDNISOLONE ACETATE 40 MG/ML INJ SUSP (RADIOLOG
120.0000 mg | Freq: Once | INTRAMUSCULAR | Status: AC
  Administered 2017-07-18: 120 mg via EPIDURAL

## 2017-07-18 MED ORDER — IOPAMIDOL (ISOVUE-M 200) INJECTION 41%
1.0000 mL | Freq: Once | INTRAMUSCULAR | Status: AC
  Administered 2017-07-18: 1 mL via EPIDURAL

## 2017-07-18 NOTE — Discharge Instructions (Signed)

## 2018-02-05 ENCOUNTER — Ambulatory Visit (HOSPITAL_COMMUNITY)
Admission: RE | Admit: 2018-02-05 | Discharge: 2018-02-05 | Disposition: A | Discharge status: Home / Self Care | Payer: Medicare Other | Source: Ambulatory Visit | Attending: Family Medicine | Admitting: Family Medicine

## 2018-02-05 ENCOUNTER — Other Ambulatory Visit (HOSPITAL_COMMUNITY): Payer: Self-pay | Admitting: Family Medicine

## 2018-02-05 DIAGNOSIS — M25512 Pain in left shoulder: Secondary | ICD-10-CM

## 2018-02-05 DIAGNOSIS — M50321 Other cervical disc degeneration at C4-C5 level: Secondary | ICD-10-CM | POA: Insufficient documentation

## 2018-02-05 DIAGNOSIS — M4312 Spondylolisthesis, cervical region: Secondary | ICD-10-CM | POA: Insufficient documentation

## 2018-06-11 ENCOUNTER — Other Ambulatory Visit (HOSPITAL_COMMUNITY): Payer: Self-pay | Admitting: Nurse Practitioner

## 2018-06-11 DIAGNOSIS — M50021 Cervical disc disorder at C4-C5 level with myelopathy: Secondary | ICD-10-CM

## 2018-06-11 DIAGNOSIS — M50022 Cervical disc disorder at C5-C6 level with myelopathy: Secondary | ICD-10-CM

## 2018-06-13 ENCOUNTER — Ambulatory Visit (HOSPITAL_COMMUNITY)
Admission: RE | Admit: 2018-06-13 | Discharge: 2018-06-13 | Disposition: A | Discharge status: Home / Self Care | Payer: Medicare Other | Source: Ambulatory Visit | Attending: Nurse Practitioner | Admitting: Nurse Practitioner

## 2018-06-13 ENCOUNTER — Other Ambulatory Visit (HOSPITAL_COMMUNITY): Payer: Self-pay | Admitting: Nurse Practitioner

## 2018-06-13 DIAGNOSIS — M25512 Pain in left shoulder: Secondary | ICD-10-CM | POA: Diagnosis present

## 2018-06-13 DIAGNOSIS — M19012 Primary osteoarthritis, left shoulder: Secondary | ICD-10-CM | POA: Diagnosis not present

## 2018-06-18 ENCOUNTER — Ambulatory Visit (HOSPITAL_COMMUNITY)
Admission: RE | Admit: 2018-06-18 | Discharge: 2018-06-18 | Disposition: A | Discharge status: Home / Self Care | Payer: Medicare Other | Source: Ambulatory Visit | Attending: Nurse Practitioner | Admitting: Nurse Practitioner

## 2018-06-18 DIAGNOSIS — M50022 Cervical disc disorder at C5-C6 level with myelopathy: Secondary | ICD-10-CM | POA: Diagnosis not present

## 2018-06-18 DIAGNOSIS — M50021 Cervical disc disorder at C4-C5 level with myelopathy: Secondary | ICD-10-CM | POA: Insufficient documentation

## 2018-06-18 DIAGNOSIS — M4802 Spinal stenosis, cervical region: Secondary | ICD-10-CM | POA: Insufficient documentation

## 2018-06-23 ENCOUNTER — Other Ambulatory Visit: Payer: Self-pay | Admitting: Nurse Practitioner

## 2018-06-23 DIAGNOSIS — M542 Cervicalgia: Secondary | ICD-10-CM

## 2018-07-09 ENCOUNTER — Ambulatory Visit
Admission: RE | Admit: 2018-07-09 | Discharge: 2018-07-09 | Disposition: A | Discharge status: Home / Self Care | Payer: Medicare Other | Source: Ambulatory Visit | Attending: Nurse Practitioner | Admitting: Nurse Practitioner

## 2018-07-09 ENCOUNTER — Telehealth: Payer: Self-pay

## 2018-07-09 DIAGNOSIS — M542 Cervicalgia: Secondary | ICD-10-CM

## 2018-07-09 MED ORDER — TRIAMCINOLONE ACETONIDE 40 MG/ML IJ SUSP (RADIOLOGY)
60.0000 mg | Freq: Once | INTRAMUSCULAR | Status: AC
  Administered 2018-07-09: 60 mg via EPIDURAL

## 2018-07-09 MED ORDER — IOPAMIDOL (ISOVUE-M 300) INJECTION 61%
1.0000 mL | Freq: Once | INTRAMUSCULAR | Status: AC | PRN
  Administered 2018-07-09: 1 mL via EPIDURAL

## 2018-07-09 NOTE — Discharge Instructions (Signed)

## 2018-08-12 ENCOUNTER — Other Ambulatory Visit: Payer: Self-pay

## 2018-08-12 ENCOUNTER — Other Ambulatory Visit (HOSPITAL_COMMUNITY)
Admission: RE | Admit: 2018-08-12 | Discharge: 2018-08-12 | Disposition: A | Discharge status: Home / Self Care | Payer: Medicare Other | Source: Ambulatory Visit | Attending: Nurse Practitioner | Admitting: Nurse Practitioner

## 2018-08-12 ENCOUNTER — Encounter (HOSPITAL_COMMUNITY): Payer: Self-pay

## 2018-08-12 ENCOUNTER — Emergency Department (HOSPITAL_COMMUNITY): Payer: Medicare Other

## 2018-08-12 ENCOUNTER — Observation Stay (HOSPITAL_COMMUNITY)
Admission: EM | Admit: 2018-08-12 | Discharge: 2018-08-14 | Disposition: A | Discharge status: Home / Self Care | Payer: Medicare Other | Attending: Internal Medicine | Admitting: Internal Medicine

## 2018-08-12 ENCOUNTER — Other Ambulatory Visit (HOSPITAL_COMMUNITY): Payer: Self-pay | Admitting: Nurse Practitioner

## 2018-08-12 ENCOUNTER — Ambulatory Visit (HOSPITAL_COMMUNITY)
Admission: RE | Admit: 2018-08-12 | Discharge: 2018-08-12 | Disposition: A | Discharge status: Home / Self Care | Payer: Medicare Other | Source: Ambulatory Visit | Attending: Nurse Practitioner | Admitting: Nurse Practitioner

## 2018-08-12 DIAGNOSIS — M79604 Pain in right leg: Secondary | ICD-10-CM | POA: Diagnosis present

## 2018-08-12 DIAGNOSIS — R52 Pain, unspecified: Secondary | ICD-10-CM

## 2018-08-12 DIAGNOSIS — I2699 Other pulmonary embolism without acute cor pulmonale: Secondary | ICD-10-CM | POA: Diagnosis not present

## 2018-08-12 DIAGNOSIS — R918 Other nonspecific abnormal finding of lung field: Secondary | ICD-10-CM | POA: Diagnosis present

## 2018-08-12 DIAGNOSIS — E119 Type 2 diabetes mellitus without complications: Secondary | ICD-10-CM

## 2018-08-12 DIAGNOSIS — I10 Essential (primary) hypertension: Secondary | ICD-10-CM

## 2018-08-12 DIAGNOSIS — I82401 Acute embolism and thrombosis of unspecified deep veins of right lower extremity: Secondary | ICD-10-CM | POA: Diagnosis present

## 2018-08-12 LAB — BRAIN NATRIURETIC PEPTIDE: B Natriuretic Peptide: 104 pg/mL — ABNORMAL HIGH (ref 0.0–100.0)

## 2018-08-12 LAB — CBG MONITORING, ED
Glucose-Capillary: 154 mg/dL — ABNORMAL HIGH (ref 70–99)
Glucose-Capillary: 197 mg/dL — ABNORMAL HIGH (ref 70–99)

## 2018-08-12 LAB — CBC
HCT: 38.6 % (ref 36.0–46.0)
Hemoglobin: 12.1 g/dL (ref 12.0–15.0)
MCH: 30.6 pg (ref 26.0–34.0)
MCHC: 31.3 g/dL (ref 30.0–36.0)
MCV: 97.7 fL (ref 80.0–100.0)
PLATELETS: 311 10*3/uL (ref 150–400)
RBC: 3.95 MIL/uL (ref 3.87–5.11)
RDW: 14 % (ref 11.5–15.5)
WBC: 11.3 10*3/uL — ABNORMAL HIGH (ref 4.0–10.5)
nRBC: 0 % (ref 0.0–0.2)

## 2018-08-12 LAB — BASIC METABOLIC PANEL
Anion gap: 13 (ref 5–15)
BUN: 14 mg/dL (ref 8–23)
CO2: 20 mmol/L — ABNORMAL LOW (ref 22–32)
Calcium: 9.5 mg/dL (ref 8.9–10.3)
Chloride: 106 mmol/L (ref 98–111)
Creatinine, Ser: 0.7 mg/dL (ref 0.44–1.00)
GFR calc Af Amer: >60 mL/min (ref >60)
GFR calc non Af Amer: >60 mL/min (ref >60)
Glucose, Bld: 150 mg/dL — ABNORMAL HIGH (ref 70–99)
Potassium: 3.4 mmol/L — ABNORMAL LOW (ref 3.5–5.1)
Sodium: 139 mmol/L (ref 135–145)

## 2018-08-12 LAB — TROPONIN I: Troponin I: <0.03 ng/mL (ref <0.03)

## 2018-08-12 LAB — D-DIMER, QUANTITATIVE: D-Dimer, Quant: 1.84 ug/mL-FEU — ABNORMAL HIGH (ref 0.00–0.50)

## 2018-08-12 MED ORDER — ACETAMINOPHEN 650 MG RE SUPP: 650.0000 mg | Freq: Four times a day (QID) | RECTAL | Status: DC | PRN

## 2018-08-12 MED ORDER — LORAZEPAM 0.5 MG PO TABS
0.5000 mg | ORAL_TABLET | Freq: Once | ORAL | Status: AC
  Administered 2018-08-12: 0.5 mg via ORAL
  Filled 2018-08-12: qty 1

## 2018-08-12 MED ORDER — SODIUM CHLORIDE 0.9 % IV SOLN
INTRAVENOUS | Status: DC
  Administered 2018-08-12: 17:00:00 via INTRAVENOUS

## 2018-08-12 MED ORDER — ENOXAPARIN SODIUM 80 MG/0.8ML ~~LOC~~ SOLN
1.0000 mg/kg | Freq: Two times a day (BID) | SUBCUTANEOUS | Status: DC
  Administered 2018-08-13: 80 mg via SUBCUTANEOUS
  Filled 2018-08-12: qty 0.8

## 2018-08-12 MED ORDER — ACETAMINOPHEN 325 MG PO TABS
650.0000 mg | ORAL_TABLET | Freq: Four times a day (QID) | ORAL | Status: DC | PRN
  Administered 2018-08-12: 650 mg via ORAL
  Filled 2018-08-12: qty 2

## 2018-08-12 MED ORDER — ENOXAPARIN SODIUM 80 MG/0.8ML ~~LOC~~ SOLN
1.0000 mg/kg | Freq: Once | SUBCUTANEOUS | Status: AC
  Administered 2018-08-12: 17:00:00 80 mg via SUBCUTANEOUS
  Filled 2018-08-12: qty 0.8

## 2018-08-12 MED ORDER — METFORMIN HCL 500 MG PO TABS
1000.0000 mg | ORAL_TABLET | Freq: Two times a day (BID) | ORAL | Status: DC
  Administered 2018-08-12 – 2018-08-13 (×2): 1000 mg via ORAL
  Filled 2018-08-12 (×2): qty 2

## 2018-08-12 MED ORDER — TRAMADOL HCL 50 MG PO TABS: 50.0000 mg | ORAL_TABLET | Freq: Four times a day (QID) | ORAL | Status: DC | PRN

## 2018-08-12 MED ORDER — POLYETHYLENE GLYCOL 3350 17 G PO PACK: 17.0000 g | PACK | Freq: Every day | ORAL | Status: DC | PRN

## 2018-08-12 MED ORDER — ONDANSETRON HCL 4 MG/2ML IJ SOLN: 4.0000 mg | Freq: Four times a day (QID) | INTRAMUSCULAR | Status: DC | PRN

## 2018-08-12 MED ORDER — ONDANSETRON HCL 4 MG PO TABS: 4.0000 mg | ORAL_TABLET | Freq: Four times a day (QID) | ORAL | Status: DC | PRN

## 2018-08-12 MED ORDER — GLIMEPIRIDE 2 MG PO TABS
4.0000 mg | ORAL_TABLET | Freq: Every day | ORAL | Status: DC
  Administered 2018-08-14: 4 mg via ORAL
  Filled 2018-08-12 (×2): qty 2
  Filled 2018-08-12: qty 1
  Filled 2018-08-12: qty 2

## 2018-08-12 MED ORDER — LEVOTHYROXINE SODIUM 100 MCG PO TABS
100.0000 ug | ORAL_TABLET | Freq: Every day | ORAL | Status: DC
  Administered 2018-08-13: 100 ug via ORAL
  Filled 2018-08-12: qty 2

## 2018-08-12 MED ORDER — AMLODIPINE BESYLATE 5 MG PO TABS
10.0000 mg | ORAL_TABLET | Freq: Every day | ORAL | Status: DC
  Administered 2018-08-14: 10 mg via ORAL
  Filled 2018-08-12: qty 2

## 2018-08-12 MED ORDER — ATORVASTATIN CALCIUM 40 MG PO TABS
80.0000 mg | ORAL_TABLET | Freq: Every day | ORAL | Status: DC
  Administered 2018-08-14: 80 mg via ORAL
  Filled 2018-08-12: qty 2

## 2018-08-12 MED ORDER — IOPAMIDOL (ISOVUE-370) INJECTION 76%
150.0000 mL | Freq: Once | INTRAVENOUS | Status: AC | PRN
  Administered 2018-08-12: 100 mL via INTRAVENOUS

## 2018-08-12 MED ORDER — INSULIN ASPART 100 UNIT/ML ~~LOC~~ SOLN
0.0000 [IU] | Freq: Three times a day (TID) | SUBCUTANEOUS | Status: DC
  Administered 2018-08-13: 1 [IU] via SUBCUTANEOUS
  Administered 2018-08-14: 2 [IU] via SUBCUTANEOUS
  Administered 2018-08-14: 1 [IU] via SUBCUTANEOUS

## 2018-08-12 MED ORDER — LISINOPRIL 10 MG PO TABS
20.0000 mg | ORAL_TABLET | Freq: Every day | ORAL | Status: DC
  Administered 2018-08-14: 20 mg via ORAL
  Filled 2018-08-12: qty 2

## 2018-08-12 MED ORDER — ALBUTEROL SULFATE HFA 108 (90 BASE) MCG/ACT IN AERS
2.0000 | INHALATION_SPRAY | RESPIRATORY_TRACT | Status: DC | PRN
  Filled 2018-08-12: qty 6.7

## 2018-08-12 NOTE — ED Triage Notes (Signed)
Pt had an ultrasound today and was diagnosed with a DVT. Pt has been having leg pain since Saturday. States she thought she pulled a muscle in her calf.

## 2018-08-12 NOTE — ED Provider Notes (Signed)
[facility] EMERGENCY DEPARTMENT Provider Note   CSN: [id number] Arrival date & time: 08/12/18  1254     History   Chief Complaint Chief Complaint  Patient presents with  . DVT    HPI Rebecca Arellano is a 73 y.o. female.  HPI   73 year old female with right leg pain/swelling and dyspnea.  Recent driving to and from [place].  First noticed symptoms last Wednesday when she felt short of breath with walking a short distance.  The symptoms have been persistent since then.  A day or 2 later she noticed some pain in her right calf and then noticed some swelling as well.  She had an outpatient ultrasound today which is positive for DVT in the right lower extremity.  She was referred to the emergency room for further evaluation given her dyspnea.  She denies any chest pain.  Past Medical History:  Diagnosis Date  . Abnormal EKG   . Allergic rhinitis   . Asthma   . Chronic back pain   . DDD (degenerative disc disease), lumbar   . Degenerative lumbar disc   . Diabetes mellitus, type II (HCC)   . Goiter   . Heart murmur   . Hypertension   . Hypothyroidism   . Other and unspecified hyperlipidemia   . Reflux     Patient Active Problem List   Diagnosis Date Noted  . LUQ pain 07/09/2016  . LLQ pain 07/09/2016  . Chronic idiopathic constipation 07/09/2016  . FH: celiac disease 07/09/2016  . Murmur, cardiac 05/27/2013  . FATTY LIVER DISEASE 03/09/2010  . History of colonic polyps 03/09/2010    Past Surgical History:  Procedure Laterality Date  . 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  . COLONOSCOPY  02/2010   Dr. Jena Gauss, left-sided diverticula, surveillance colonoscopy recommended for 5 years  . COLONOSCOPY WITH PROPOFOL N/A 08/06/2016   Procedure: COLONOSCOPY WITH PROPOFOL;  Surgeon: Corbin Ade, MD;  Location: [facility];  Service: Endoscopy;  Laterality: N/A;  7:30 am     OB History   No obstetric history on file.       Home Medications    Prior to Admission medications   Medication Sig Start Date End Date Taking? Authorizing Provider  amLODipine (NORVASC) 10 MG tablet Take 1 tablet by mouth daily. 05/19/13   [provider]  aspirin 325 MG tablet Take 325 mg by mouth daily.    [provider]  atorvastatin (LIPITOR) 80 MG tablet Take 1 tablet by mouth daily. 06/04/16   [provider]  FIBER ADULT GUMMIES PO Take 1 tablet by mouth daily.    [provider]  glimepiride (AMARYL) 4 MG tablet Take 4 mg by mouth daily with breakfast.  04/15/13   [provider]  ibuprofen (ADVIL,MOTRIN) 200 MG tablet Take by mouth daily. Takes 1-3 by mouth in mornings    [provider]  levothyroxine (SYNTHROID, LEVOTHROID) 100 MCG tablet Take 1 tablet by mouth daily. 02/04/15   [provider]  lisinopril (PRINIVIL,ZESTRIL) 20 MG tablet Take 1 tablet (20 mg total) by mouth daily. 03/29/14   Laqueta Linden, MD  metFORMIN (GLUCOPHAGE) 1000 MG tablet Take 1 tablet by mouth 2 (two) times daily. 04/15/13   [provider]  Probiotic Product (PROBIOTIC DAILY PO) Take 1 tablet by mouth daily.     [provider]  traMADol (ULTRAM) 50 MG tablet Take 50 mg by mouth daily as needed for  moderate pain.  06/04/16   [provider]    Family History Family History  Problem Relation Age of Onset  . Colitis Mother   . Pancreatitis Brother        No etoh. idiopathic per patient.   . Celiac disease Brother   . Irritable bowel syndrome Brother   . Colon cancer Neg Hx   . Inflammatory bowel disease Neg Hx     Social History Social History   Tobacco Use  . Smoking status: Never Smoker  . Smokeless tobacco: Never Used  Substance Use Topics  . Alcohol use: No  . Drug use: No     Allergies   Keflex [cephalexin]   Review of Systems Review of Systems All systems reviewed and negative, other than as noted in HPI.   Physical  Exam Updated Vital Signs BP (!) 153/84 (BP Location: Right Arm)   Pulse 92   Temp 98.1 F (36.7 C) (Oral)   Resp 18   Ht 5\' 2"  (1.575 m)   Wt 79.8 kg   SpO2 96%   BMI 32.19 kg/m   Physical Exam Vitals signs and nursing note reviewed.  Constitutional:      General: She is not in acute distress.    Appearance: She is well-developed.  HENT:     Head: Normocephalic and atraumatic.  Eyes:     General:        Right eye: No discharge.        Left eye: No discharge.     Conjunctiva/sclera: Conjunctivae normal.  Neck:     Musculoskeletal: Neck supple.  Cardiovascular:     Rate and Rhythm: Normal rate and regular rhythm.     Heart sounds: Normal heart sounds. No murmur. No friction rub. No gallop.   Pulmonary:     Effort: Pulmonary effort is normal. No respiratory distress.     Breath sounds: Normal breath sounds.  Abdominal:     General: There is no distension.     Palpations: Abdomen is soft.     Tenderness: There is no abdominal tenderness.  Musculoskeletal:        General: Swelling and tenderness present.     Comments: Mild swelling of the right lower extremity as compared to left.  Calf tenderness.  Negative Homans.  Palpable DP pulse.  Skin:    General: Skin is warm and dry.  Neurological:     Mental Status: She is alert.  Psychiatric:        Behavior: Behavior normal.        Thought Content: Thought content normal.      ED Treatments / Results  Labs (all labs ordered are listed, but only abnormal results are displayed) Labs Reviewed  BASIC METABOLIC PANEL - Abnormal; Notable for the following components:      Result Value   Potassium 3.4 (*)    CO2 20 (*)    Glucose, Bld 150 (*)    All other components within normal limits  CBG MONITORING, ED - Abnormal; Notable for the following components:   Glucose-Capillary 154 (*)    All other components within normal limits  TROPONIN I    EKG None  Radiology Ct Angio Chest Pe W And/or Wo Contrast  Result  Date: 08/12/2018 CLINICAL DATA:  Shortness of breath for the past 5 days. EXAM: CT ANGIOGRAPHY CHEST WITH CONTRAST TECHNIQUE: Multidetector CT imaging of the chest was performed using the standard protocol during bolus administration of intravenous contrast. Multiplanar CT image  reconstructions and MIPs were obtained to evaluate the vascular anatomy. CONTRAST:  100mL ISOVUE-370 IOPAMIDOL (ISOVUE-370) INJECTION 76% COMPARISON:  None. FINDINGS: Cardiovascular: Multiple small bilateral pulmonary arterial filling defects. Mildly enlarged heart. Atheromatous calcifications, including the coronary arteries and aorta. Mediastinum/Nodes: No enlarged lymph nodes. Lungs/Pleura: 4 mm right upper lobe nodule on image number 60 series 6. 4 mm right middle lobe nodule on image number 96 series 6. 3 mm right lower lobe nodule on image number 97 series 6. 4 mm left lower lobe nodule on image number 109 series 6. 3 mm subpleural lingular nodule on image number 94 series 6. Tiny subpleural nodule in the left upper lobe on image number 73 series 6. No airspace consolidation or pleural fluid. Upper Abdomen: Mild diffuse low density of the liver relative to the spleen. Musculoskeletal: Mild thoracic spine degenerative changes. Review of the MIP images confirms the above findings. IMPRESSION: 1. Multiple small bilateral pulmonary emboli. These are not extensive enough to create right heart strain. 2. Multiple small bilateral lung nodules, as described above. No follow-up needed if patient is low-risk (and has no known or suspected primary neoplasm). Non-contrast chest CT can be considered in 12 months if patient is high-risk. This recommendation follows the consensus statement: Guidelines for Management of Incidental Pulmonary Nodules Detected on CT Images: From the Fleischner Society 2017; Radiology 2017; 284:228-243. 3. Mild cardiomegaly. 4.  Calcific coronary artery and aortic atherosclerosis. 5. Mild diffuse hepatic steatosis.  Critical Value/emergent results were called by telephone at the time of interpretation on 08/12/2018 at 4:58 pm to Dr. Raeford RazorSTEPHEN Christle Nolting , who verbally acknowledged these results. Aortic Atherosclerosis (ICD10-I70.0). Electronically Signed   By: Beckie SaltsSteven  Reid M.D.   On: 08/12/2018 17:03   [place] Venous Img Lower Unilateral Right  Result Date: 08/12/2018 CLINICAL DATA:  Right leg pain. EXAM: RIGHT LOWER EXTREMITY VENOUS DOPPLER ULTRASOUND TECHNIQUE: Gray-scale sonography with graded compression, as well as color Doppler and duplex ultrasound were performed to evaluate the lower extremity deep venous systems from the level of the common femoral vein and including the common femoral, femoral, profunda femoral, popliteal and calf veins including the posterior tibial, peroneal and gastrocnemius veins when visible. The superficial great saphenous vein was also interrogated. Spectral Doppler was utilized to evaluate flow at rest and with distal augmentation maneuvers in the common femoral, femoral and popliteal veins. COMPARISON:  None. FINDINGS: Contralateral Common Femoral Vein: Respiratory phasicity is normal and symmetric with the symptomatic side. No evidence of thrombus. Normal compressibility. Common Femoral Vein: No evidence of thrombus. Normal compressibility, respiratory phasicity and response to augmentation. Saphenofemoral Junction: No evidence of thrombus. Normal compressibility and flow on color Doppler imaging. Profunda Femoral Vein: No evidence of thrombus. Normal compressibility and flow on color Doppler imaging. Femoral Vein: No evidence of thrombus. Normal compressibility, respiratory phasicity and response to augmentation. Popliteal Vein: No evidence of thrombus. Normal compressibility, respiratory phasicity and response to augmentation. Calf Veins: Positive for thrombus. Evidence for thrombus in the posterior tibial veins and peroneal veins. Superficial Great Saphenous Vein: No evidence of thrombus. Normal  compressibility. Other Findings:  None. IMPRESSION: Positive for deep venous thrombosis in the right lower extremity. Thrombus involving the right posterior tibial veins and right peroneal veins. Electronically Signed   By: Richarda OverlieAdam  Henn M.D.   On: 08/12/2018 12:56    Procedures Procedures (including critical care time)  CRITICAL CARE Performed by: Raeford RazorStephen Sariyah Corcino Total critical care time: 35 minutes Critical care time was exclusive of separately billable procedures and treating other  patients. Critical care was necessary to treat or prevent imminent or life-threatening deterioration. Critical care was time spent personally by me on the following activities: development of treatment plan with patient and/or surrogate as well as nursing, discussions with consultants, evaluation of patient's response to treatment, examination of patient, obtaining history from patient or surrogate, ordering and performing treatments and interventions, ordering and review of laboratory studies, ordering and review of radiographic studies, pulse oximetry and re-evaluation of patient's condition.   Medications Ordered in ED Medications  0.9 %  sodium chloride infusion (has no administration in time range)  enoxaparin (LOVENOX) injection 80 mg (has no administration in time range)  iopamidol (ISOVUE-370) 76 % injection 150 mL (100 mLs Intravenous Contrast Given 08/12/18 1626)     Initial Impression / Assessment and Plan / ED Course  I have reviewed the triage vital signs and the nursing notes.  Pertinent labs & imaging results that were available during my care of the patient were reviewed by me and considered in my medical decision making (see chart for details).     73 year old female with right lower extremity pain and swelling.  Ultrasound today positive for DVT.  Also endorsing dyspnea.  Subsequent CT angiogram positive for small bilateral PEs.  Possibly precipitated by recent car travel to [place].   Anticoagulation.  Will discuss with medicine for admission.  Final Clinical Impressions(s) / ED Diagnoses   Final diagnoses:  Other acute pulmonary embolism without acute cor pulmonale [facility](HCC)    ED Discharge Orders    None       Raeford RazorKohut, Mylen Mangan, MD 08/12/18 1720

## 2018-08-12 NOTE — Progress Notes (Signed)
ANTICOAGULATION CONSULT NOTE - Follow Up Consult  Pharmacy Consult for lovenox Indication: pulmonary embolus and DVT  Allergies  Allergen Reactions  . Keflex [Cephalexin] Swelling and Rash    Rash and trouble swelling     Patient Measurements: Height: 5\' 2"  (157.5 cm) Weight: 176 lb (79.8 kg) IBW/kg (Calculated) : 50.1  Vital Signs: Temp: 98.1 F (36.7 C) (01/14 1324) Temp Source: Oral (01/14 1324) BP: 104/72 (01/14 2030) Pulse Rate: 77 (01/14 2030)  Labs: Recent Labs    08/12/18 1159 08/12/18 1453  HGB 12.1  --   HCT 38.6  --   PLT 311  --   CREATININE  --  0.70  TROPONINI  --  <0.03    Estimated Creatinine Clearance: 63.1 mL/min (by C-G formula based on SCr of 0.7 mg/dL).   Medications:  (Not in a hospital admission)  Scheduled:  . [START ON 08/13/2018] enoxaparin (LOVENOX) injection  1 mg/kg Subcutaneous Q12H   Infusions:  . sodium chloride 100 mL/hr at 08/12/18 1729   PRN:  Anti-infectives (From admission, onward)   None      Assessment: 73 year old female presents to ED, Pharmacy consulted for lovenox therapy for DVT & PE.  Goal of Therapy:  anticoagulation for DVT & PE Monitor platelets by anticoagulation protocol: Yes   Plan:  Lovenox 80mg  subq q12h (1mg /kg) Monitor for signs and symptoms of bleeding  Gerre Pebbles Hayleen Clinkscales 08/12/2018,8:47 PM

## 2018-08-12 NOTE — ED Notes (Signed)
Ambulated Pt to restroom. Pt ambulated with no acute distress or complication.

## 2018-08-12 NOTE — H&P (Signed)
History and Physical    Rebecca Arellano QMV:[id number] DOB: Oct 13, 1945 DOA: 08/12/2018  PCP: Gareth Morgan, MD   Patient coming from: Home  I have personally briefly reviewed patient's old medical records in [facility]  Chief Complaint: SOB, Leg pain  HPI: Rebecca Arellano is a 73 y.o. female with medical history significant for hypothyroidism, hypertension, DM, chronic back pain, asthma who presented to the ED with complaints of difficulty breathing or ambulating a week ago, next day she noticed pain in her right calf.  On the day symptoms started patient was driving alone to [place], [place].  She subsequently drove back this past Saturday, 3 days ago. She denies swelling or redness of her lower extremities.  No fever no chills no rash.  No chest pain. Patient saw her primary care provider today, ultrasound of her right lower extremity showed a DVT.  Patient was subsequently referred to the ED.  She denies personal history of blood clots.  Her father and her brother who she went to visit in [place] and is now in hospice care, may have had blood clots.  She denies GI blood loss history.  ED Course: Blood pressure systolic 130s to 284X, heart rate 80s to 90s, O2 sats greater than 92 percent on room air.  CTA chest showed multiple but small bilateral pulmonary emboli.  Not extensive enough to create right heart strain.  Multiple small bilateral lung nodules. Trop X 1 - 0.03.  Patient was started on IV Lovenox.  Hospitalist to admit for PE and DVT.  Review of Systems: As per HPI all other systems reviewed and negative  Past Medical History:  Diagnosis Date  . Abnormal EKG   . Allergic rhinitis   . Asthma   . Chronic back pain   . DDD (degenerative disc disease), lumbar   . Degenerative lumbar disc   . Diabetes mellitus, type II (HCC)   . Goiter   . Heart murmur   . Hypertension   . Hypothyroidism   . Other and unspecified hyperlipidemia   . Reflux     Past  Surgical History:  Procedure Laterality Date  . 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  . COLONOSCOPY  02/2010   Dr. Jena Gauss, left-sided diverticula, surveillance colonoscopy recommended for 5 years  . COLONOSCOPY WITH PROPOFOL N/A 08/06/2016   Procedure: COLONOSCOPY WITH PROPOFOL;  Surgeon: Corbin Ade, MD;  Location: [facility];  Service: Endoscopy;  Laterality: N/A;  7:30 am     reports that she has never smoked. She has never used smokeless tobacco. She reports that she does not drink alcohol or use drugs.  Allergies  Allergen Reactions  . Keflex [Cephalexin]     Rash and trouble swelling     Family History  Problem Relation Age of Onset  . Colitis Mother   . Pancreatitis Brother        No etoh. idiopathic per patient.   . Celiac disease Brother   . Irritable bowel syndrome Brother   . Colon cancer Neg Hx   . Inflammatory bowel disease Neg Hx     Prior to Admission medications   Medication Sig Start Date End Date Taking? Authorizing Provider  amLODipine (NORVASC) 10 MG tablet Take 1 tablet by mouth daily. 05/19/13   [provider]  aspirin 325 MG tablet Take 325 mg by mouth daily.    [provider]  atorvastatin (LIPITOR) 80 MG tablet Take 1 tablet by  mouth daily. 06/04/16   [provider]  FIBER ADULT GUMMIES PO Take 1 tablet by mouth daily.    [provider]  glimepiride (AMARYL) 4 MG tablet Take 4 mg by mouth daily with breakfast.  04/15/13   [provider]  ibuprofen (ADVIL,MOTRIN) 200 MG tablet Take by mouth daily. Takes 1-3 by mouth in mornings    [provider]  levothyroxine (SYNTHROID, LEVOTHROID) 100 MCG tablet Take 1 tablet by mouth daily. 02/04/15   [provider]  lisinopril (PRINIVIL,ZESTRIL) 20 MG tablet Take 1 tablet (20 mg total) by mouth daily. 03/29/14   Laqueta Linden, MD  metFORMIN (GLUCOPHAGE) 1000 MG tablet Take 1 tablet by mouth 2 (two) times  daily. 04/15/13   [provider]  Probiotic Product (PROBIOTIC DAILY PO) Take 1 tablet by mouth daily.     [provider]  traMADol (ULTRAM) 50 MG tablet Take 50 mg by mouth daily as needed for moderate pain.  06/04/16   [provider]    Physical Exam: Vitals:   08/12/18 1800 08/12/18 1830 08/12/18 1930 08/12/18 2000  BP: (!) 147/75  (!) 156/88 (!) 140/99  Pulse: 86 95 91 93  Resp: 18 20 20    Temp:      TempSrc:      SpO2: 95% 96% 100% 92%  Weight:      Height:        Constitutional: Patient very anxious and jittery, reports nervousness because she is in the hospital Vitals:   08/12/18 1800 08/12/18 1830 08/12/18 1930 08/12/18 2000  BP: (!) 147/75  (!) 156/88 (!) 140/99  Pulse: 86 95 91 93  Resp: 18 20 20    Temp:      TempSrc:      SpO2: 95% 96% 100% 92%  Weight:      Height:       Eyes: PERRL, lids and conjunctivae normal ENMT: Mucous membranes are moist. Posterior pharynx clear of any exudate or lesions. Neck: normal, supple, no masses, no thyromegaly Respiratory: clear to auscultation bilaterally, no wheezing, no crackles. Normal respiratory effort. No accessory muscle use.  Cardiovascular: Regular rate and rhythm, no murmurs / rubs / gallops. No extremity edema.  No erythema or pain.  2+ pedal pulses.   Abdomen: no tenderness, no masses palpated. No hepatosplenomegaly. Bowel sounds positive.  Musculoskeletal: no clubbing / cyanosis. No joint deformity upper and lower extremities. Good ROM, no contractures. Normal muscle tone.  Skin: no rashes, lesions, ulcers. No induration Neurologic: CN 2-12 grossly intact.  Strength 5/5 in all 4.  Psychiatric: Normal judgment and insight. Alert and oriented x 3. Normal mood.   Labs on Admission: I have personally reviewed following labs and imaging studies  CBC: Recent Labs  Lab 08/12/18 1159  WBC 11.3*  HGB 12.1  HCT 38.6  MCV 97.7  PLT 311   Basic Metabolic Panel: Recent Labs  Lab  08/12/18 1453  NA 139  K 3.4*  CL 106  CO2 20*  GLUCOSE 150*  BUN 14  CREATININE 0.70  CALCIUM 9.5   Cardiac Enzymes: Recent Labs  Lab 08/12/18 1453  TROPONINI <0.03   CBG: Recent Labs  Lab 08/12/18 1329  GLUCAP 154*   Radiological Exams on Admission: Ct Angio Chest Pe W And/or Wo Contrast  Result Date: 08/12/2018 CLINICAL DATA:  Shortness of breath for the past 5 days. EXAM: CT ANGIOGRAPHY CHEST WITH CONTRAST TECHNIQUE: Multidetector CT imaging of the chest was performed using the standard protocol during  bolus administration of intravenous contrast. Multiplanar CT image reconstructions and MIPs were obtained to evaluate the vascular anatomy. CONTRAST:  ISOVUE-370 IOPAMIDOL (ISOVUE-370) INJECTION 76% COMPARISON:  None. FINDINGS: Cardiovascular: Multiple small bilateral pulmonary arterial filling defects. Mildly enlarged heart. Atheromatous calcifications, including the coronary arteries and aorta. Mediastinum/Nodes: No enlarged lymph nodes. Lungs/Pleura: 4 mm right upper lobe nodule on image number 60 series 6. 4 mm right middle lobe nodule on image number 96 series 6. 3 mm right lower lobe nodule on image number 97 series 6. 4 mm left lower lobe nodule on image number 109 series 6. 3 mm subpleural lingular nodule on image number 94 series 6. Tiny subpleural nodule in the left upper lobe on image number 73 series 6. No airspace consolidation or pleural fluid. Upper Abdomen: Mild diffuse low density of the liver relative to the spleen. Musculoskeletal: Mild thoracic spine degenerative changes. Review of the MIP images confirms the above findings. IMPRESSION: 1. Multiple small bilateral pulmonary emboli. These are not extensive enough to create right heart strain. 2. Multiple small bilateral lung nodules, as described above. No follow-up needed if patient is low-risk (and has no known or suspected primary neoplasm). Non-contrast chest CT can be considered in 12 months if patient is  high-risk. This recommendation follows the consensus statement: Guidelines for Management of Incidental Pulmonary Nodules Detected on CT Images: From the Fleischner Society 2017; Radiology 2017; 284:228-243. 3. Mild cardiomegaly. 4.  Calcific coronary artery and aortic atherosclerosis. 5. Mild diffuse hepatic steatosis. Critical Value/emergent results were called by telephone at the time of interpretation on 08/12/2018 at 4:58 pm to Dr. Raeford Razor , who verbally acknowledged these results. Aortic Atherosclerosis (ICD10-I70.0). Electronically Signed   By: Beckie Salts M.D.   On: 08/12/2018 17:03   US Venous Img Lower Unilateral Right  Result Date: 08/12/2018 CLINICAL DATA:  Right leg pain. EXAM: RIGHT LOWER EXTREMITY VENOUS DOPPLER ULTRASOUND TECHNIQUE: Gray-scale sonography with graded compression, as well as color Doppler and duplex ultrasound were performed to evaluate the lower extremity deep venous systems from the level of the common femoral vein and including the common femoral, femoral, profunda femoral, popliteal and calf veins including the posterior tibial, peroneal and gastrocnemius veins when visible. The superficial great saphenous vein was also interrogated. Spectral Doppler was utilized to evaluate flow at rest and with distal augmentation maneuvers in the common femoral, femoral and popliteal veins. COMPARISON:  None. FINDINGS: Contralateral Common Femoral Vein: Respiratory phasicity is normal and symmetric with the symptomatic side. No evidence of thrombus. Normal compressibility. Common Femoral Vein: No evidence of thrombus. Normal compressibility, respiratory phasicity and response to augmentation. Saphenofemoral Junction: No evidence of thrombus. Normal compressibility and flow on color Doppler imaging. Profunda Femoral Vein: No evidence of thrombus. Normal compressibility and flow on color Doppler imaging. Femoral Vein: No evidence of thrombus. Normal compressibility, respiratory phasicity  and response to augmentation. Popliteal Vein: No evidence of thrombus. Normal compressibility, respiratory phasicity and response to augmentation. Calf Veins: Positive for thrombus. Evidence for thrombus in the posterior tibial veins and peroneal veins. Superficial Great Saphenous Vein: No evidence of thrombus. Normal compressibility. Other Findings:  None. IMPRESSION: Positive for deep venous thrombosis in the right lower extremity. Thrombus involving the right posterior tibial veins and right peroneal veins. Electronically Signed   By: Richarda Overlie M.D.   On: 08/12/2018 12:56    EKG: None  Assessment/Plan Active Problems:   Pulmonary embolism (HCC)  Pulmonary embolism and DVT- Provoked.  Recent Drive to [place]  and back. Trops x1-<0.03.  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 - CBC a.m  Lung nodules-incidental finding. -Follow-up outpatient  Hypothyroidism-  -Continue home medication Synthroid 100 daily  HTN-stable -Continue home medications lisinopril 20, Norvasc 10.  DM-glucose 150. -Continue home glimepiride and metformin  Asthma-stable -Continue home BDs  DVT prophylaxis: Lovenox Code Status: full Family Communication: None at bedside Disposition Plan: Per rounding team Consults called: None Admission status: OBS, telemetry   Jarryn Altland Wendall StadeE Danylle Ouk MD [facility] Pager [phone] From 3PM- 11PM.  Otherwise please contact night-coverage [email or web address] Password [email or web address]  08/12/2018, 9:42 PM

## 2018-08-13 ENCOUNTER — Encounter (HOSPITAL_COMMUNITY): Payer: Self-pay

## 2018-08-13 ENCOUNTER — Observation Stay (HOSPITAL_BASED_OUTPATIENT_CLINIC_OR_DEPARTMENT_OTHER): Payer: Medicare Other

## 2018-08-13 DIAGNOSIS — I2699 Other pulmonary embolism without acute cor pulmonale: Secondary | ICD-10-CM | POA: Diagnosis not present

## 2018-08-13 DIAGNOSIS — R918 Other nonspecific abnormal finding of lung field: Secondary | ICD-10-CM

## 2018-08-13 LAB — GLUCOSE, CAPILLARY
Glucose-Capillary: 119 mg/dL — ABNORMAL HIGH (ref 70–99)
Glucose-Capillary: 129 mg/dL — ABNORMAL HIGH (ref 70–99)

## 2018-08-13 LAB — CBC
HCT: 37 % (ref 36.0–46.0)
Hemoglobin: 11.6 g/dL — ABNORMAL LOW (ref 12.0–15.0)
MCH: 30 pg (ref 26.0–34.0)
MCHC: 31.4 g/dL (ref 30.0–36.0)
MCV: 95.6 fL (ref 80.0–100.0)
Platelets: 301 10*3/uL (ref 150–400)
RBC: 3.87 MIL/uL (ref 3.87–5.11)
RDW: 14.1 % (ref 11.5–15.5)
WBC: 8.1 10*3/uL (ref 4.0–10.5)
nRBC: 0 % (ref 0.0–0.2)

## 2018-08-13 LAB — ECHOCARDIOGRAM COMPLETE
Height: 62 in
Weight: 2816 oz

## 2018-08-13 MED ORDER — LEVOTHYROXINE SODIUM 50 MCG PO TABS
ORAL_TABLET | ORAL | Status: AC
  Filled 2018-08-13: qty 1

## 2018-08-13 MED ORDER — RIVAROXABAN 20 MG PO TABS: 20.0000 mg | ORAL_TABLET | Freq: Every day | ORAL | Status: DC

## 2018-08-13 MED ORDER — ALBUTEROL SULFATE (2.5 MG/3ML) 0.083% IN NEBU: 2.5000 mg | INHALATION_SOLUTION | RESPIRATORY_TRACT | Status: DC | PRN

## 2018-08-13 MED ORDER — LEVOTHYROXINE SODIUM 100 MCG PO TABS
100.0000 ug | ORAL_TABLET | Freq: Every day | ORAL | Status: DC
  Administered 2018-08-14: 100 ug via ORAL
  Filled 2018-08-13: qty 1

## 2018-08-13 MED ORDER — RIVAROXABAN 15 MG PO TABS
15.0000 mg | ORAL_TABLET | Freq: Two times a day (BID) | ORAL | Status: DC
  Administered 2018-08-13 – 2018-08-14 (×2): 15 mg via ORAL
  Filled 2018-08-13 (×2): qty 1

## 2018-08-13 NOTE — Progress Notes (Signed)
*  PRELIMINARY RESULTS* Echocardiogram 2D Echocardiogram has been performed.  Rebecca Arellano 08/13/2018, 4:31 PM

## 2018-08-13 NOTE — Care Management Obs Status (Signed)
MEDICARE OBSERVATION STATUS NOTIFICATION   Patient Details  Name: Rebecca Arellano MRN: [id number] Date of Birth: 09-Sep-1945   Medicare Observation Status Notification Given:  Yes    Renie Ora 08/13/2018, 1:44 PM

## 2018-08-13 NOTE — Progress Notes (Signed)
ANTICOAGULATION CONSULT NOTE - Follow Up Consult  Pharmacy Consult for Lovenox transition to rivaroxaban Indication: pulmonary embolus  Allergies  Allergen Reactions  . Keflex [Cephalexin] Swelling and Rash    Rash and trouble swelling     Patient Measurements: Height: 5\' 2"  (157.5 cm) Weight: 176 lb (79.8 kg) IBW/kg (Calculated) : 50.1 Heparin Dosing Weight: HEPARIN DW (KG): 67.8   Vital Signs: Temp: 98.3 F (36.8 C) (01/15 1009) Temp Source: Oral (01/15 1009) BP: 144/82 (01/15 1009) Pulse Rate: 90 (01/15 1009)  Labs: Recent Labs    08/12/18 1159 08/12/18 1453 08/13/18 0524  HGB 12.1  --  11.6*  HCT 38.6  --  37.0  PLT 311  --  301  CREATININE  --  0.70  --   TROPONINI  --  <0.03  --     Estimated Creatinine Clearance: 63.1 mL/min (by C-G formula based on SCr of 0.7 mg/dL).   Assessment: Pharmacy consulted to dose rivaroxaban for this 44 yof with pulmonary embolus.  Patient has received two doses of Lovenox 80mg  in the past 24 hrs.  Goal of Therapy:    Monitor platelets by anticoagulation protocol: Yes   Plan:  Discontinue Lovenox   wait until 2hrs before next dose of Lovenox due to give first dose of rivaroxaban Start rivaroxaban 15mg  bid x21 days (last dose on 09/03/18 at 0800) Continue rivaroxaban 20mg  daily with supper starting on 09/04/18. Monitor for signs and symptoms of bleeding.  Tama High 08/13/2018,10:45 AM

## 2018-08-13 NOTE — Progress Notes (Signed)
PROGRESS NOTE    Rebecca Arellano  FYB:[id number] DOB: Jan 05, 1946 DOA: 08/12/2018 PCP: Gareth Morgan, MD  Brief Narrative: Patient seen and examined, this is a 73 year old female admitted last night with acute bilateral PE and DVT, she has prior history of diabetes, hypertension, chronic back pain, asthma who just took a long road trip to [place] with her spouse following this noticed some pain in her right calf and difficulty with activity/ambulation, she went to her PCPs office yesterday where she was diagnosed with a DVT and subsequently sent to the emergency room. -Work-up in the ED noted extensive small bilateral PE  Assessment & Plan:   Acute extensive bilateral PE and DVT -This is provoked secondary to recent long drive -We will stop subcu Lovenox, start Xarelto per pharmacy, discussed anticoagulation options with the patient -She is hemodynamically stable, follow-up echo echocardiogram -Recommended anticoagulation for 3 to 6 months -Management consulted to evaluate cost/co-pay  Pulmonary nodules -Incidental finding, needs outpatient follow-up  Hypothyroidism -Continue Synthroid  Diabetes mellitus -CBG stable, continue glimepiride she is eating and drinking well -Hold metformin post contrast CT  Asthma -Stable, continue duo nebs  Hypertension -BP is stable, continue lisinopril and Norvasc  DVT prophylaxis: Lovenox Code Status: full Family Communication: None at bedside Disposition Plan: Home tomorrow if stable  Consultants:   none   Procedures:   Antimicrobials:    Subjective: -Feels better, some dyspnea with activity Objective: Vitals:   08/13/18 0030 08/13/18 0745 08/13/18 0930 08/13/18 1009  BP: 126/71 (!) 148/85 (!) 145/71 (!) 144/82  Pulse: 70 82 82 90  Resp: 14 19 (!) 21 20  Temp:    98.3 F (36.8 C)  TempSrc:    Oral  SpO2: 97% 97% 96% 100%  Weight:      Height:        Intake/Output Summary (Last 24 hours) at 08/13/2018 1139 Last  data filed at 08/13/2018 1021 Gross per 24 hour  Intake 1116.67 ml  Output -  Net 1116.67 ml   Filed Weights   08/12/18 1324  Weight: 79.8 kg    Examination:  General exam: Appears calm and comfortable , no distress  respiratory system: Clear to auscultation. Respiratory effort normal. Cardiovascular system: S1 & S2 heard, RRR.  Gastrointestinal system: Abdomen is nondistended, soft and nontender.Normal bowel sounds heard. Central nervous system: Alert and oriented. No focal neurological deficits. Extremities: Trace edema Skin: No rashes, lesions or ulcers Psychiatry: Judgement and insight appear normal. Mood & affect appropriate.     Data Reviewed:   CBC: Recent Labs  Lab 08/12/18 1159 08/13/18 0524  WBC 11.3* 8.1  HGB 12.1 11.6*  HCT 38.6 37.0  MCV 97.7 95.6  PLT 311 301   Basic Metabolic Panel: Recent Labs  Lab 08/12/18 1453  NA 139  K 3.4*  CL 106  CO2 20*  GLUCOSE 150*  BUN 14  CREATININE 0.70  CALCIUM 9.5   GFR: Estimated Creatinine Clearance: 63.1 mL/min (by C-G formula based on SCr of 0.7 mg/dL). Liver Function Tests: No results for input(s): AST, ALT, ALKPHOS, BILITOT, PROT, ALBUMIN in the last 168 hours. No results for input(s): LIPASE, AMYLASE in the last 168 hours. No results for input(s): AMMONIA in the last 168 hours. Coagulation Profile: No results for input(s): INR, PROTIME in the last 168 hours. Cardiac Enzymes: Recent Labs  Lab 08/12/18 1453  TROPONINI <0.03   BNP (last 3 results) No results for input(s): PROBNP in the last 8760 hours. HbA1C: No results for input(s): HGBA1C  in the last 72 hours. CBG: Recent Labs  Lab 08/12/18 1329 08/12/18 2315 08/13/18 0745  GLUCAP 154* 197* 119*   Lipid Profile: No results for input(s): CHOL, HDL, LDLCALC, TRIG, CHOLHDL, LDLDIRECT in the last 72 hours. Thyroid Function Tests: No results for input(s): TSH, T4TOTAL, FREET4, T3FREE, THYROIDAB in the last 72 hours. Anemia Panel: No  results for input(s): VITAMINB12, FOLATE, FERRITIN, TIBC, IRON, RETICCTPCT in the last 72 hours. Urine analysis: No results found for: COLORURINE, APPEARANCEUR, LABSPEC, PHURINE, GLUCOSEU, HGBUR, BILIRUBINUR, KETONESUR, PROTEINUR, UROBILINOGEN, NITRITE, LEUKOCYTESUR Sepsis Labs: @LABRCNTIP (procalcitonin:4,lacticidven:4)  )No results found for this or any previous visit (from the past 240 hour(s)).       Radiology Studies: Ct Angio Chest Pe W And/or Wo Contrast  Result Date: 08/12/2018 CLINICAL DATA:  Shortness of breath for the past 5 days. EXAM: CT ANGIOGRAPHY CHEST WITH CONTRAST TECHNIQUE: Multidetector CT imaging of the chest was performed using the standard protocol during bolus administration of intravenous contrast. Multiplanar CT image reconstructions and MIPs were obtained to evaluate the vascular anatomy. CONTRAST:  100mL ISOVUE-370 IOPAMIDOL (ISOVUE-370) INJECTION 76% COMPARISON:  None. FINDINGS: Cardiovascular: Multiple small bilateral pulmonary arterial filling defects. Mildly enlarged heart. Atheromatous calcifications, including the coronary arteries and aorta. Mediastinum/Nodes: No enlarged lymph nodes. Lungs/Pleura: 4 mm right upper lobe nodule on image number 60 series 6. 4 mm right middle lobe nodule on image number 96 series 6. 3 mm right lower lobe nodule on image number 97 series 6. 4 mm left lower lobe nodule on image number 109 series 6. 3 mm subpleural lingular nodule on image number 94 series 6. Tiny subpleural nodule in the left upper lobe on image number 73 series 6. No airspace consolidation or pleural fluid. Upper Abdomen: Mild diffuse low density of the liver relative to the spleen. Musculoskeletal: Mild thoracic spine degenerative changes. Review of the MIP images confirms the above findings. IMPRESSION: 1. Multiple small bilateral pulmonary emboli. These are not extensive enough to create right heart strain. 2. Multiple small bilateral lung nodules, as described above.  No follow-up needed if patient is low-risk (and has no known or suspected primary neoplasm). Non-contrast chest CT can be considered in 12 months if patient is high-risk. This recommendation follows the consensus statement: Guidelines for Management of Incidental Pulmonary Nodules Detected on CT Images: From the Fleischner Society 2017; Radiology 2017; 284:228-243. 3. Mild cardiomegaly. 4.  Calcific coronary artery and aortic atherosclerosis. 5. Mild diffuse hepatic steatosis. Critical Value/emergent results were called by telephone at the time of interpretation on 08/12/2018 at 4:58 pm to Dr. Raeford RazorSTEPHEN KOHUT , who verbally acknowledged these results. Aortic Atherosclerosis (ICD10-I70.0). Electronically Signed   By: Beckie SaltsSteven  Reid M.D.   On: 08/12/2018 17:03   [place] Venous Img Lower Unilateral Right  Result Date: 08/12/2018 CLINICAL DATA:  Right leg pain. EXAM: RIGHT LOWER EXTREMITY VENOUS DOPPLER ULTRASOUND TECHNIQUE: Gray-scale sonography with graded compression, as well as color Doppler and duplex ultrasound were performed to evaluate the lower extremity deep venous systems from the level of the common femoral vein and including the common femoral, femoral, profunda femoral, popliteal and calf veins including the posterior tibial, peroneal and gastrocnemius veins when visible. The superficial great saphenous vein was also interrogated. Spectral Doppler was utilized to evaluate flow at rest and with distal augmentation maneuvers in the common femoral, femoral and popliteal veins. COMPARISON:  None. FINDINGS: Contralateral Common Femoral Vein: Respiratory phasicity is normal and symmetric with the symptomatic side. No evidence of thrombus. Normal compressibility. Common Femoral Vein:  No evidence of thrombus. Normal compressibility, respiratory phasicity and response to augmentation. Saphenofemoral Junction: No evidence of thrombus. Normal compressibility and flow on color Doppler imaging. Profunda Femoral Vein: No  evidence of thrombus. Normal compressibility and flow on color Doppler imaging. Femoral Vein: No evidence of thrombus. Normal compressibility, respiratory phasicity and response to augmentation. Popliteal Vein: No evidence of thrombus. Normal compressibility, respiratory phasicity and response to augmentation. Calf Veins: Positive for thrombus. Evidence for thrombus in the posterior tibial veins and peroneal veins. Superficial Great Saphenous Vein: No evidence of thrombus. Normal compressibility. Other Findings:  None. IMPRESSION: Positive for deep venous thrombosis in the right lower extremity. Thrombus involving the right posterior tibial veins and right peroneal veins. Electronically Signed   By: Richarda OverlieAdam  Henn M.D.   On: 08/12/2018 12:56        Scheduled Meds: . amLODipine  10 mg Oral Daily  . atorvastatin  80 mg Oral Daily  . glimepiride  4 mg Oral Q breakfast  . insulin aspart  0-9 Units Subcutaneous TID WC  . levothyroxine      . [START ON 08/14/2018] levothyroxine  100 mcg Oral Daily  . lisinopril  20 mg Oral Daily  . metFORMIN  1,000 mg Oral BID  . Rivaroxaban  15 mg Oral BID WC  . [START ON 09/04/2018] rivaroxaban  20 mg Oral Q supper   Continuous Infusions:   LOS: 0 days    Time spent: 35min    Zannie CovePreetha Ossie Yebra, MD [facility] Page via [email or web address], password [email or web address] After 7PM please contact night-coverage  08/13/2018, 11:39 AM

## 2018-08-13 NOTE — Care Management (Signed)
Per [facility] Co-pay amount for Eliquis 5mg BID is $47.00 for a 30 day supply. Co-pay amount for Xarelto 15 mg. daily for 30 day supply $47.00. No PA required Deductible of $50.00 has been met. Mail order  with [facility]. for 90 day supply for Eliquis 5mg. and or Xarelto 15mg will be $131.00. Pharmacies: [facility]. Ref# for this call is: [id number].  

## 2018-08-14 DIAGNOSIS — I2699 Other pulmonary embolism without acute cor pulmonale: Secondary | ICD-10-CM | POA: Diagnosis not present

## 2018-08-14 DIAGNOSIS — R918 Other nonspecific abnormal finding of lung field: Secondary | ICD-10-CM | POA: Diagnosis not present

## 2018-08-14 LAB — BASIC METABOLIC PANEL
Anion gap: 11 (ref 5–15)
BUN: 15 mg/dL (ref 8–23)
CO2: 23 mmol/L (ref 22–32)
Calcium: 9.1 mg/dL (ref 8.9–10.3)
Chloride: 106 mmol/L (ref 98–111)
Creatinine, Ser: 0.68 mg/dL (ref 0.44–1.00)
GFR calc Af Amer: >60 mL/min (ref >60)
GFR calc non Af Amer: >60 mL/min (ref >60)
Glucose, Bld: 149 mg/dL — ABNORMAL HIGH (ref 70–99)
Potassium: 3.6 mmol/L (ref 3.5–5.1)
Sodium: 140 mmol/L (ref 135–145)

## 2018-08-14 LAB — GLUCOSE, CAPILLARY
Glucose-Capillary: 143 mg/dL — ABNORMAL HIGH (ref 70–99)
Glucose-Capillary: 173 mg/dL — ABNORMAL HIGH (ref 70–99)

## 2018-08-14 LAB — CBC
HCT: 39.4 % (ref 36.0–46.0)
Hemoglobin: 12.3 g/dL (ref 12.0–15.0)
MCH: 30.4 pg (ref 26.0–34.0)
MCHC: 31.2 g/dL (ref 30.0–36.0)
MCV: 97.5 fL (ref 80.0–100.0)
Platelets: 339 10*3/uL (ref 150–400)
RBC: 4.04 MIL/uL (ref 3.87–5.11)
RDW: 14 % (ref 11.5–15.5)
WBC: 8.9 10*3/uL (ref 4.0–10.5)
nRBC: 0 % (ref 0.0–0.2)

## 2018-08-14 MED ORDER — RIVAROXABAN (XARELTO) VTE STARTER PACK (15 & 20 MG): ORAL_TABLET | ORAL | 0 refills | Status: DC

## 2018-08-14 NOTE — Care Management (Signed)
Pt recommended to use a cane. Pt has no preference of DME provider. Bonita [facility]Linda, [facility] given referral and will deliver DME to pt room prior to DC today.

## 2018-08-14 NOTE — Discharge Instructions (Signed)
Pulmonary Embolism    A pulmonary embolism (PE) is a sudden blockage or decrease of blood flow in one lung or both lungs. Most blockages come from a blood clot that forms in a lower leg, thigh, or arm vein (deep vein thrombosis, DVT) and travels to the lungs. A clot is blood that has thickened into a gel or solid. PE is a dangerous and life-threatening condition that needs to be treated right away.  What are the causes?  This condition is usually caused by a blood clot that forms in a vein and moves to the lungs. In rare cases, it may be caused by air, fat, part of a tumor, or other tissue that moves through the veins and into the lungs.  What increases the risk?  The following factors may make you more likely to develop this condition:  · Traumatic injury, such as breaking a hip or leg.  · Spinal cord injury.  · Orthopedic surgery, especially hip or knee replacement.  · Any major surgery.  · Stroke.  · Having DVT.  · Blood clots or blood clotting disease.  · Long-term (chronic) lung or heart disease.  · Taking medicines that contain estrogen. These include birth control pills and hormone replacement therapy.  · Cancer and chemotherapy.  · Having a central venous catheter.  · Pregnancy and the period of time after delivery (postpartum).  · Being older than age 60.  · Being overweight.  · Smoking.  What are the signs or symptoms?  Symptoms of this condition usually start suddenly and include:  · Shortness of breath during activity or at rest.  · Coughing or coughing up blood or blood-tinged mucus.  · Chest pain that is often worse with deep breaths.  · Rapid or irregular heartbeat.  · Feeling light-headed or dizzy.  · Fainting.  · Feeling anxious.  · Fever.  · Sweating.  · Pain and swelling in a leg. This is a symptom of DVT, which can lead to PE.  How is this diagnosed?  This condition may be diagnosed based on:  · Your medical history.  · A physical exam.  · Blood tests.  · CT pulmonary angiogram. This test checks  blood flow in and around your lungs.  · Ventilation-perfusion scan, also called a lung VQ scan. This test measures air flow and blood flow to the lungs.  · Ultrasound of the legs.  How is this treated?  Treatment for this condition depends on many factors, such as the cause of your PE, your risk for bleeding or developing more clots, and other medical conditions you have. Treatment aims to remove, dissolve, or stop blood clots from forming or growing larger. Treatment may include:  · Medicines, such as:  ? Blood thinning medicines (anticoagulants) to stop clots from forming or growing.  ? Medicines that dissolve clots (thrombolytics).  · Procedures, such as:  ? Using a flexible tube to remove a blood clot (embolectomy) or deliver medicine to destroy it (catheter-directed thrombolysis).  ? Inserting a filter into a large vein that carries blood to the heart (inferior vena cava). This filter (vena cava filter) catches blood clots before they reach the lungs.  ? Surgery to remove the clot (surgical embolectomy). This is rare.  You may need a combination of immediate, long-term (up to 3 months after diagnosis), and extended (more than 3 months after diagnosis) treatments. Your treatment may continue for several months (maintenance therapy). You and your health care provider will work together   to choose the treatment program that is best for you.  Follow these instructions at home:  Medicines  · Take over-the-counter and prescription medicines only as told by your health care provider.  · If you are taking an anticoagulant medicine:  ? Take the medicine every day at the same time each day.  ? Understand what foods and drugs interact with your medicine.  ? Understand the side effects of this medicine, including excessive bruising or bleeding. Ask your health care provider or pharmacist about other side effects.  General instructions  · Wear a medical alert bracelet or carry a medical alert card that says you have had a PE  and lists what medicines you take.  · Ask your health care provider when you may return to your normal activities. Avoid sitting or lying for a long time without moving.  · Maintain a healthy weight. Ask your health care provider what weight is healthy for you.  · Do not use any products that contain nicotine or tobacco, such as cigarettes and e-cigarettes. If you need help quitting, ask your health care provider.  · Talk with your health care provider about any travel plans. It is important to make sure that you are still able to take your medicine while on trips.  · Keep all follow-up visits as told by your health care provider. This is important.  Contact a health care provider if:  · You missed a dose of your blood thinner medicine.  Get help right away if:  · You have:  ? New or increased pain, swelling, warmth, or redness in an arm or leg.  ? Numbness or tingling in an arm or leg.  ? Shortness of breath during activity or at rest.  ? A fever.  ? Chest pain.  ? A rapid or irregular heartbeat.  ? A severe headache.  ? Vision changes.  ? A serious fall or accident, or you hit your head.  ? Stomach (abdominal) pain.  ? Blood in your vomit, stool, or urine.  ? A cut that will not stop bleeding.  · You cough up blood.  · You feel light-headed or dizzy.  · You cannot move your arms or legs.  · You are confused or have memory loss.  These symptoms may represent a serious problem that is an emergency. Do not wait to see if the symptoms will go away. Get medical help right away. Call your local emergency services (911 in the [place] Do not drive yourself to the hospital.  Summary  · A pulmonary embolism (PE) is a sudden blockage or decrease of blood flow in one lung or both lungs. PE is a dangerous and life-threatening condition that needs to be treated right away.  · Treatments for this condition usually include medicines to thin your blood (anticoagulants) or medicines to break apart blood clots (thrombolytics).  · If  you are given blood thinners, it is important to take the medicine every single day at the same time each day.  · If you have signs of PE or DVT, call your local emergency services (911 in the [place]  This information is not intended to replace advice given to you by your health care provider. Make sure you discuss any questions you have with your health care provider.  Document Released: 07/13/2000 Document Revised: 02/28/2018 Document Reviewed: 08/29/2017  Elsevier Interactive Patient Education © 2019 [employer]

## 2018-08-14 NOTE — Discharge Summary (Signed)
Discharge Summary  Rebecca Arellano:[id number] DOB: 1945/10/08  PCP: Gareth Morgan, MD  Admit date: 08/12/2018 Discharge date: 08/14/2018  Time spent: 35 minutes  Recommendations for Outpatient Follow-up:  1. Follow-up with your PCP 2. Take your medications as prescribed  Discharge Diagnoses:  Active Hospital Problems   Diagnosis Date Noted  . Pulmonary embolism (HCC) 08/12/2018  . DM (diabetes mellitus) (HCC) 08/12/2018  . HTN (hypertension) 08/12/2018  . Right leg DVT (HCC) 08/12/2018  . Pulmonary nodules 08/12/2018    Resolved Hospital Problems  No resolved problems to display.    Discharge Condition: Stable  Diet recommendation: Resume previous diet  Vitals:   08/13/18 2234 08/14/18 0537  BP: (!) 150/79 130/77  Pulse: 75 76  Resp: 20 20  Temp: 98.6 F (37 C) 98.6 F (37 C)  SpO2: 100% 96%    History of present illness:   Patient seen and examined, this is a 73 year old female admitted last night with acute bilateral PE and DVT, she has prior history of diabetes, hypertension, chronic back pain, asthma who just took a long road trip to [place] with her spouse following this noticed some pain in her right calf and difficulty with activity/ambulation, she went to her PCPs office yesterday where she was diagnosed with a DVT and subsequently sent to the emergency room. -Work-up in the ED noted extensive small bilateral PE.  08/14/2018: Patient seen and examined at her bedside.  No acute events overnight.  No new complaints.  Denies chest pain, dyspnea or palpitations.  On the day of discharge, the patient was hemodynamically stable.  She will need to follow-up with her primary care provider and take her medications as prescribed.  Hospital Course:  Principal Problem:   Pulmonary embolism (HCC) Active Problems:   DM (diabetes mellitus) (HCC)   HTN (hypertension)   Right leg DVT (HCC)   Pulmonary nodules  Acute extensive bilateral PE and DVT -This  is provoked secondary to recent long drive -We will stop subcu Lovenox, start Xarelto per pharmacy, discussed anticoagulation options with the patient -She is hemodynamically stable, follow-up echo echocardiogram -Recommended anticoagulation for 3 to 6 months -Management consulted to evaluate cost/co-pay  Pulmonary nodules -Incidental finding, needs outpatient follow-up  Hypothyroidism -Continue Synthroid  Diabetes mellitus -CBG stable, continue glimepiride she is eating and drinking well -Hold metformin post contrast CT  Asthma -Stable, continue duo nebs  Hypertension -BP is stable, continue lisinopril and Norvasc  DVT prophylaxis:Lovenox Code Status:full Family Communication:None at bedside Disposition Plan: Home tomorrow if stable   Discharge Exam: BP 130/77 (BP Location: Right Arm)   Pulse 76   Temp 98.6 F (37 C) (Oral)   Resp 20   Ht 5\' 2"  (1.575 m)   Wt 79.8 kg   SpO2 96%   BMI 32.19 kg/m  . General: 73 y.o. year-old female well developed well nourished in no acute distress.  Alert and oriented x3. . Cardiovascular: Regular rate and rhythm with no rubs or gallops.  No thyromegaly or JVD noted.   Marland Kitchen Respiratory: Clear to auscultation with no wheezes or rales. Good inspiratory effort. . Abdomen: Soft nontender nondistended with normal bowel sounds x4 quadrants. . Musculoskeletal: No lower extremity edema. 2/4 pulses in all 4 extremities. Marland Kitchen Psychiatry: Mood is appropriate for condition and setting  Discharge Instructions You were cared for by a hospitalist during your hospital stay. If you have any questions about your discharge medications or the care you received while you were in the hospital after you  are discharged, you can call the unit and asked to speak with the hospitalist on call if the hospitalist that took care of you is not available. Once you are discharged, your primary care physician will handle any further medical issues. Please note that NO  REFILLS for any discharge medications will be authorized once you are discharged, as it is imperative that you return to your primary care physician (or establish a relationship with a primary care physician if you do not have one) for your aftercare needs so that they can reassess your need for medications and monitor your lab values.   Allergies as of 08/14/2018      Reactions   Keflex [cephalexin] Swelling, Rash   Rash and trouble swelling       Medication List    STOP taking these medications   ibuprofen 200 MG tablet Commonly known as:  ADVIL,MOTRIN     TAKE these medications   amLODipine 10 MG tablet Commonly known as:  NORVASC Take 1 tablet by mouth daily.   aspirin 325 MG tablet Take 325 mg by mouth daily.   atorvastatin 80 MG tablet Commonly known as:  LIPITOR Take 1 tablet by mouth daily.   FIBER ADULT GUMMIES PO Take 1 tablet by mouth daily.   glimepiride 4 MG tablet Commonly known as:  AMARYL Take 4 mg by mouth daily with breakfast.   levothyroxine 100 MCG tablet Commonly known as:  SYNTHROID, LEVOTHROID Take 1 tablet by mouth daily.   lisinopril 20 MG tablet Commonly known as:  PRINIVIL,ZESTRIL Take 1 tablet (20 mg total) by mouth daily.   metFORMIN 1000 MG tablet Commonly known as:  GLUCOPHAGE Take 1 tablet by mouth 2 (two) times daily.   PROAIR HFA 108 (90 Base) MCG/ACT inhaler Generic drug:  albuterol Inhale 2 puffs into the lungs every 4 (four) hours as needed for wheezing or shortness of breath.   PROBIOTIC DAILY PO Take 1 tablet by mouth every evening.   Rivaroxaban 15 & 20 MG Tbpk Take as directed on package: Start with one 15mg  tablet by mouth twice a day with food. On Day 22, switch to one 20mg  tablet once a day with food.   traMADol 50 MG tablet Commonly known as:  ULTRAM Take 50 mg by mouth 4 (four) times daily as needed for moderate pain.      Allergies  Allergen Reactions  . Keflex [Cephalexin] Swelling and Rash    Rash and  trouble swelling    Follow-up Information    Gareth Morgan, MD. Call in 1 day(s).   Specialty:  Family Medicine Why:  please call for a post hospital follow up apppointment. Contact information: [address] [place] [address] [phone]            The results of significant diagnostics from this hospitalization (including imaging, microbiology, ancillary and laboratory) are listed below for reference.    Significant Diagnostic Studies: Ct Angio Chest Pe W And/or Wo Contrast  Result Date: 08/12/2018 CLINICAL DATA:  Shortness of breath for the past 5 days. EXAM: CT ANGIOGRAPHY CHEST WITH CONTRAST TECHNIQUE: Multidetector CT imaging of the chest was performed using the standard protocol during bolus administration of intravenous contrast. Multiplanar CT image reconstructions and MIPs were obtained to evaluate the vascular anatomy. CONTRAST:  ISOVUE-370 IOPAMIDOL (ISOVUE-370) INJECTION 76% COMPARISON:  None. FINDINGS: Cardiovascular: Multiple small bilateral pulmonary arterial filling defects. Mildly enlarged heart. Atheromatous calcifications, including the coronary arteries and aorta. Mediastinum/Nodes: No enlarged lymph nodes.  Lungs/Pleura: 4 mm right upper lobe nodule on image number 60 series 6. 4 mm right middle lobe nodule on image number 96 series 6. 3 mm right lower lobe nodule on image number 97 series 6. 4 mm left lower lobe nodule on image number 109 series 6. 3 mm subpleural lingular nodule on image number 94 series 6. Tiny subpleural nodule in the left upper lobe on image number 73 series 6. No airspace consolidation or pleural fluid. Upper Abdomen: Mild diffuse low density of the liver relative to the spleen. Musculoskeletal: Mild thoracic spine degenerative changes. Review of the MIP images confirms the above findings. IMPRESSION: 1. Multiple small bilateral pulmonary emboli. These are not extensive enough to create right heart strain. 2. Multiple small  bilateral lung nodules, as described above. No follow-up needed if patient is low-risk (and has no known or suspected primary neoplasm). Non-contrast chest CT can be considered in 12 months if patient is high-risk. This recommendation follows the consensus statement: Guidelines for Management of Incidental Pulmonary Nodules Detected on CT Images: From the Fleischner Society 2017; Radiology 2017; 284:228-243. 3. Mild cardiomegaly. 4.  Calcific coronary artery and aortic atherosclerosis. 5. Mild diffuse hepatic steatosis. Critical Value/emergent results were called by telephone at the time of interpretation on 08/12/2018 at 4:58 pm to Dr. Raeford Razor , who verbally acknowledged these results. Aortic Atherosclerosis (ICD10-I70.0). Electronically Signed   By: Beckie Salts M.D.   On: 08/12/2018 17:03   US Venous Img Lower Unilateral Right  Result Date: 08/12/2018 CLINICAL DATA:  Right leg pain. EXAM: RIGHT LOWER EXTREMITY VENOUS DOPPLER ULTRASOUND TECHNIQUE: Gray-scale sonography with graded compression, as well as color Doppler and duplex ultrasound were performed to evaluate the lower extremity deep venous systems from the level of the common femoral vein and including the common femoral, femoral, profunda femoral, popliteal and calf veins including the posterior tibial, peroneal and gastrocnemius veins when visible. The superficial great saphenous vein was also interrogated. Spectral Doppler was utilized to evaluate flow at rest and with distal augmentation maneuvers in the common femoral, femoral and popliteal veins. COMPARISON:  None. FINDINGS: Contralateral Common Femoral Vein: Respiratory phasicity is normal and symmetric with the symptomatic side. No evidence of thrombus. Normal compressibility. Common Femoral Vein: No evidence of thrombus. Normal compressibility, respiratory phasicity and response to augmentation. Saphenofemoral Junction: No evidence of thrombus. Normal compressibility and flow on color  Doppler imaging. Profunda Femoral Vein: No evidence of thrombus. Normal compressibility and flow on color Doppler imaging. Femoral Vein: No evidence of thrombus. Normal compressibility, respiratory phasicity and response to augmentation. Popliteal Vein: No evidence of thrombus. Normal compressibility, respiratory phasicity and response to augmentation. Calf Veins: Positive for thrombus. Evidence for thrombus in the posterior tibial veins and peroneal veins. Superficial Great Saphenous Vein: No evidence of thrombus. Normal compressibility. Other Findings:  None. IMPRESSION: Positive for deep venous thrombosis in the right lower extremity. Thrombus involving the right posterior tibial veins and right peroneal veins. Electronically Signed   By: Richarda Overlie M.D.   On: 08/12/2018 12:56    Microbiology: No results found for this or any previous visit (from the past 240 hour(s)).   Labs: Basic Metabolic Panel: Recent Labs  Lab 08/12/18 1453 08/14/18 0604  NA 139 140  K 3.4* 3.6  CL 106 106  CO2 20* 23  GLUCOSE 150* 149*  BUN 14 15  CREATININE 0.70 0.68  CALCIUM 9.5 9.1   Liver Function Tests: No results for input(s): AST, ALT, ALKPHOS, BILITOT, PROT, ALBUMIN in  the last 168 hours. No results for input(s): LIPASE, AMYLASE in the last 168 hours. No results for input(s): AMMONIA in the last 168 hours. CBC: Recent Labs  Lab 08/12/18 1159 08/13/18 0524 08/14/18 0604  WBC 11.3* 8.1 8.9  HGB 12.1 11.6* 12.3  HCT 38.6 37.0 39.4  MCV 97.7 95.6 97.5  PLT 311 301 339   Cardiac Enzymes: Recent Labs  Lab 08/12/18 1453  TROPONINI <0.03   BNP: BNP (last 3 results) Recent Labs    08/12/18 1159  BNP 104.0*    ProBNP (last 3 results) No results for input(s): PROBNP in the last 8760 hours.  CBG: Recent Labs  Lab 08/12/18 2315 08/13/18 0745 08/13/18 1724 08/14/18 0758 08/14/18 1220  GLUCAP 197* 119* 129* 143* 173*       Signed:  Darlin Droparole N Hall, MD [facility] 08/14/2018, 1:55 PM

## 2018-08-14 NOTE — Evaluation (Signed)
Physical Therapy Evaluation Patient Details Name: Rebecca Arellano MRN: [id number] DOB: August 26, 1945 Today's Date: 08/14/2018   History of Present Illness  Rebecca Arellano is a 73 y.o. female with medical history significant for hypothyroidism, hypertension, DM, chronic back pain, asthma who presented to the ED with complaints of difficulty breathing or ambulating a week ago, next day she noticed pain in her right calf.  On the day symptoms started patient was driving alone to [place], [place].  She subsequently drove back this past Saturday, 3 days ago. She denies swelling or redness of her lower extremities.  No fever no chills no rash.  No chest pain.    Clinical Impression  Patient functioning near baseline for functional mobility and gait, other than slight unsteadiness, staggering right/left when going up/down ramps and able to self correct without loss of balance.  Patient instructed in and give written instructions for HEP with good return demonstrated and understanding acknowledged.  Patient encouraged to use cane for longer distances and/or going up down ramps.  Plan:  Patient discharged from physical therapy to care of nursing for ambulation daily as tolerated for length of stay with recommendation below.     Follow Up Recommendations No PT follow up    Equipment Recommendations  Cane    Recommendations for Other Services       Precautions / Restrictions Precautions Precautions: None Restrictions Weight Bearing Restrictions: No      Mobility  Bed Mobility Overal bed mobility: Independent                Transfers Overall transfer level: Independent                  Ambulation/Gait Ambulation/Gait assistance: Modified independent (Device/Increase time) Gait Distance (Feet): 300 Feet Assistive device: None Gait Pattern/deviations: WFL(Within Functional Limits) Gait velocity: decreased   General Gait Details: grossly WFL except occassional  stumbling, staggering left/right when going up/down ramps without loss of balance, no stumbling/staggering on level surfaces  Stairs            Wheelchair Mobility    Modified Rankin (Stroke Patients Only)       Balance Overall balance assessment: Mild deficits observed, not formally tested                                           Pertinent Vitals/Pain Pain Assessment: No/denies pain    Home Living Family/patient expects to be discharged to:: Private residence Living Arrangements: Spouse/significant other Available Help at Discharge: Family Type of Home: Mobile home Home Access: Stairs to enter Entrance Stairs-Rails: None Entrance Stairs-Number of Steps: 1 Home Layout: One level Home Equipment: Crutches      Prior Function Level of Independence: Independent         Comments: community ambulator, drives     Higher education careers adviser        Extremity/Trunk Assessment   Upper Extremity Assessment Upper Extremity Assessment: Overall WFL for tasks assessed    Lower Extremity Assessment Lower Extremity Assessment: Overall WFL for tasks assessed    Cervical / Trunk Assessment Cervical / Trunk Assessment: Normal  Communication   Communication: No difficulties  Cognition Arousal/Alertness: Awake/alert Behavior During Therapy: WFL for tasks assessed/performed Overall Cognitive Status: Within Functional Limits for tasks assessed  General Comments      Exercises     Assessment/Plan    PT Assessment Patent does not need any further PT services  PT Problem List         PT Treatment Interventions      PT Goals (Current goals can be found in the Care Plan section)  Acute Rehab PT Goals Patient Stated Goal: return home with boyfriend to assist PT Goal Formulation: With patient Time For Goal Achievement: 08/14/18 Potential to Achieve Goals: Good    Frequency     Barriers to  discharge        Co-evaluation               AM-PAC PT "6 Clicks" Mobility  Outcome Measure Help needed turning from your back to your side while in a flat bed without using bedrails?: None Help needed moving from lying on your back to sitting on the side of a flat bed without using bedrails?: None Help needed moving to and from a bed to a chair (including a wheelchair)?: None Help needed standing up from a chair using your arms (e.g., wheelchair or bedside chair)?: None Help needed to walk in hospital room?: None Help needed climbing 3-5 steps with a railing? : None 6 Click Score: 24    End of Session   Activity Tolerance: Patient tolerated treatment well;Patient limited by fatigue Patient left: in bed;with call bell/phone within reach Nurse Communication: Mobility status PT Visit Diagnosis: Unsteadiness on feet (R26.81);Other abnormalities of gait and mobility (R26.89);Muscle weakness (generalized) (M62.81)    Time: 2409-7353 PT Time Calculation (min) (ACUTE ONLY): 28 min   Charges:   PT Evaluation $PT Eval Moderate Complexity: 1 Mod PT Treatments $Gait Training: 23-37 mins        3:12 PM, 08/14/18 Ocie Bob, MPT Physical Therapist with [facility] 336 [phone] office [phone] mobile phone

## 2019-04-30 HISTORY — PX: OTHER SURGICAL HISTORY: SHX169

## 2019-07-06 ENCOUNTER — Other Ambulatory Visit: Payer: Self-pay

## 2019-07-06 ENCOUNTER — Ambulatory Visit (HOSPITAL_COMMUNITY)
Admission: RE | Admit: 2019-07-06 | Discharge: 2019-07-06 | Disposition: A | Discharge status: Home / Self Care | Payer: Medicare Other | Source: Ambulatory Visit | Attending: Family Medicine | Admitting: Family Medicine

## 2019-07-06 ENCOUNTER — Other Ambulatory Visit: Payer: Self-pay | Admitting: Family Medicine

## 2019-07-06 DIAGNOSIS — Z86711 Personal history of pulmonary embolism: Secondary | ICD-10-CM | POA: Insufficient documentation

## 2019-07-06 DIAGNOSIS — M7989 Other specified soft tissue disorders: Secondary | ICD-10-CM | POA: Diagnosis present

## 2019-08-18 ENCOUNTER — Other Ambulatory Visit: Payer: Self-pay

## 2019-08-18 ENCOUNTER — Other Ambulatory Visit (HOSPITAL_COMMUNITY): Payer: Self-pay | Admitting: Family Medicine

## 2019-08-18 ENCOUNTER — Ambulatory Visit (HOSPITAL_COMMUNITY)
Admission: RE | Admit: 2019-08-18 | Discharge: 2019-08-18 | Disposition: A | Discharge status: Home / Self Care | Payer: Medicare Other | Source: Ambulatory Visit | Attending: Family Medicine | Admitting: Family Medicine

## 2019-08-18 DIAGNOSIS — M5441 Lumbago with sciatica, right side: Secondary | ICD-10-CM | POA: Insufficient documentation

## 2019-09-14 ENCOUNTER — Other Ambulatory Visit: Payer: Self-pay | Admitting: Family Medicine

## 2019-09-14 DIAGNOSIS — M5440 Lumbago with sciatica, unspecified side: Secondary | ICD-10-CM

## 2019-09-29 ENCOUNTER — Ambulatory Visit: Payer: Medicare Other | Attending: Internal Medicine

## 2019-09-29 ENCOUNTER — Other Ambulatory Visit: Payer: Self-pay

## 2019-09-29 ENCOUNTER — Ambulatory Visit (HOSPITAL_COMMUNITY)
Admission: RE | Admit: 2019-09-29 | Discharge: 2019-09-29 | Disposition: A | Discharge status: Home / Self Care | Payer: Medicare Other | Source: Ambulatory Visit | Attending: Family Medicine | Admitting: Family Medicine

## 2019-09-29 DIAGNOSIS — M5440 Lumbago with sciatica, unspecified side: Secondary | ICD-10-CM | POA: Insufficient documentation

## 2019-09-29 DIAGNOSIS — Z23 Encounter for immunization: Secondary | ICD-10-CM | POA: Insufficient documentation

## 2019-09-29 NOTE — Progress Notes (Signed)
   Covid-19 Vaccination Clinic  Name:  MARSHAY SLATES    MRN: [id number] DOB: 30-Jul-1946  09/29/2019  Ms. Vidana was observed post Covid-19 immunization for 15 minutes without incident. She was provided with Vaccine Information Sheet and instruction to access the V-Safe system.   Ms. Bribiesca was instructed to call 911 with any severe reactions post vaccine: Marland Kitchen Difficulty breathing  . Swelling of face and throat  . A fast heartbeat  . A bad rash all over body  . Dizziness and weakness   Immunizations Administered    Name Date Dose VIS Date Route   Moderna COVID-19 Vaccine 09/29/2019 10:36 AM 0.5 mL 06/30/2019 Intramuscular   Manufacturer: Moderna   Lot: [id number]   NDC: 97530-051-10

## 2019-10-12 ENCOUNTER — Other Ambulatory Visit: Payer: Self-pay | Admitting: Nurse Practitioner

## 2019-10-12 DIAGNOSIS — M4306 Spondylolysis, lumbar region: Secondary | ICD-10-CM

## 2019-10-27 ENCOUNTER — Ambulatory Visit: Payer: Medicare Other | Attending: Internal Medicine

## 2019-10-27 DIAGNOSIS — Z23 Encounter for immunization: Secondary | ICD-10-CM

## 2019-10-27 NOTE — Progress Notes (Signed)
   Covid-19 Vaccination Clinic  Name:  Rebecca Arellano    MRN: [id number] DOB: 11/30/1945  10/27/2019  [name] was observed post Covid-19 immunization for 15 minutes without incident. She was provided with Vaccine Information Sheet and instruction to access the V-Safe system.   [name] was instructed to call 911 with any severe reactions post vaccine: Marland Kitchen Difficulty breathing  . Swelling of face and throat  . A fast heartbeat  . A bad rash all over body  . Dizziness and weakness   Immunizations Administered    Name Date Dose VIS Date Route   Moderna COVID-19 Vaccine 10/27/2019 10:36 AM 0.5 mL 06/30/2019 Intramuscular   Manufacturer: Moderna   Lot: [id number]   NDC: 56387-564-33

## 2019-11-03 ENCOUNTER — Other Ambulatory Visit: Payer: Self-pay | Admitting: Nurse Practitioner

## 2019-11-03 ENCOUNTER — Other Ambulatory Visit: Payer: Self-pay

## 2019-11-03 ENCOUNTER — Ambulatory Visit
Admission: RE | Admit: 2019-11-03 | Discharge: 2019-11-03 | Disposition: A | Discharge status: Home / Self Care | Payer: Medicare Other | Source: Ambulatory Visit | Attending: Nurse Practitioner | Admitting: Nurse Practitioner

## 2019-11-03 DIAGNOSIS — M4306 Spondylolysis, lumbar region: Secondary | ICD-10-CM

## 2019-11-03 MED ORDER — IOPAMIDOL (ISOVUE-M 200) INJECTION 41%
1.0000 mL | Freq: Once | INTRAMUSCULAR | Status: AC
  Administered 2019-11-03: 10:00:00 1 mL via EPIDURAL

## 2019-11-03 MED ORDER — METHYLPREDNISOLONE ACETATE 40 MG/ML INJ SUSP (RADIOLOG
120.0000 mg | Freq: Once | INTRAMUSCULAR | Status: AC
  Administered 2019-11-03: 120 mg via EPIDURAL

## 2019-11-03 NOTE — Discharge Instructions (Signed)

## 2019-12-09 ENCOUNTER — Other Ambulatory Visit: Payer: Self-pay | Admitting: Nurse Practitioner

## 2019-12-09 DIAGNOSIS — M4306 Spondylolysis, lumbar region: Secondary | ICD-10-CM

## 2019-12-22 ENCOUNTER — Ambulatory Visit
Admission: RE | Admit: 2019-12-22 | Discharge: 2019-12-22 | Disposition: A | Discharge status: Home / Self Care | Payer: Medicare Other | Source: Ambulatory Visit | Attending: Nurse Practitioner | Admitting: Nurse Practitioner

## 2019-12-22 ENCOUNTER — Other Ambulatory Visit: Payer: Self-pay

## 2019-12-22 DIAGNOSIS — M4306 Spondylolysis, lumbar region: Secondary | ICD-10-CM

## 2019-12-22 MED ORDER — METHYLPREDNISOLONE ACETATE 40 MG/ML INJ SUSP (RADIOLOG
120.0000 mg | Freq: Once | INTRAMUSCULAR | Status: AC
  Administered 2019-12-22: 120 mg via EPIDURAL

## 2019-12-22 MED ORDER — IOPAMIDOL (ISOVUE-M 200) INJECTION 41%
1.0000 mL | Freq: Once | INTRAMUSCULAR | Status: AC
  Administered 2019-12-22: 1 mL via EPIDURAL

## 2019-12-22 NOTE — Discharge Instructions (Signed)

## 2020-01-27 ENCOUNTER — Other Ambulatory Visit: Payer: Self-pay | Admitting: Nurse Practitioner

## 2020-01-27 DIAGNOSIS — M47817 Spondylosis without myelopathy or radiculopathy, lumbosacral region: Secondary | ICD-10-CM

## 2020-02-04 ENCOUNTER — Inpatient Hospital Stay: Admission: RE | Admit: 2020-02-04 | Payer: Medicare Other | Source: Ambulatory Visit

## 2020-02-10 ENCOUNTER — Ambulatory Visit
Admission: RE | Admit: 2020-02-10 | Discharge: 2020-02-10 | Disposition: A | Discharge status: Home / Self Care | Payer: Medicare Other | Source: Ambulatory Visit | Attending: Nurse Practitioner | Admitting: Nurse Practitioner

## 2020-02-10 ENCOUNTER — Other Ambulatory Visit: Payer: Self-pay | Admitting: Nurse Practitioner

## 2020-02-10 ENCOUNTER — Other Ambulatory Visit: Payer: Self-pay

## 2020-02-10 DIAGNOSIS — M47817 Spondylosis without myelopathy or radiculopathy, lumbosacral region: Secondary | ICD-10-CM

## 2020-02-10 MED ORDER — IOPAMIDOL (ISOVUE-M 200) INJECTION 41%
1.0000 mL | Freq: Once | INTRAMUSCULAR | Status: AC
  Administered 2020-02-10: 1 mL via EPIDURAL

## 2020-02-10 MED ORDER — METHYLPREDNISOLONE ACETATE 40 MG/ML INJ SUSP (RADIOLOG
120.0000 mg | Freq: Once | INTRAMUSCULAR | Status: AC
  Administered 2020-02-10: 120 mg via EPIDURAL

## 2020-02-10 NOTE — Discharge Instructions (Signed)

## 2020-04-08 ENCOUNTER — Other Ambulatory Visit: Payer: Self-pay | Admitting: Nurse Practitioner

## 2020-04-08 ENCOUNTER — Other Ambulatory Visit (HOSPITAL_COMMUNITY): Payer: Self-pay | Admitting: Nurse Practitioner

## 2020-04-08 ENCOUNTER — Ambulatory Visit (HOSPITAL_COMMUNITY)
Admission: RE | Admit: 2020-04-08 | Discharge: 2020-04-08 | Disposition: A | Discharge status: Home / Self Care | Payer: Medicare Other | Source: Ambulatory Visit | Attending: Nurse Practitioner | Admitting: Nurse Practitioner

## 2020-04-08 ENCOUNTER — Other Ambulatory Visit: Payer: Self-pay

## 2020-04-08 DIAGNOSIS — R2242 Localized swelling, mass and lump, left lower limb: Secondary | ICD-10-CM

## 2020-05-03 ENCOUNTER — Encounter (HOSPITAL_COMMUNITY): Payer: Self-pay | Admitting: Physical Therapy

## 2020-05-03 ENCOUNTER — Other Ambulatory Visit: Payer: Self-pay

## 2020-05-03 ENCOUNTER — Ambulatory Visit (HOSPITAL_COMMUNITY): Payer: Medicare Other | Attending: Neurosurgery | Admitting: Physical Therapy

## 2020-05-03 DIAGNOSIS — G8929 Other chronic pain: Secondary | ICD-10-CM | POA: Diagnosis present

## 2020-05-03 DIAGNOSIS — M2569 Stiffness of other specified joint, not elsewhere classified: Secondary | ICD-10-CM

## 2020-05-03 DIAGNOSIS — M545 Low back pain, unspecified: Secondary | ICD-10-CM | POA: Diagnosis not present

## 2020-05-03 DIAGNOSIS — R262 Difficulty in walking, not elsewhere classified: Secondary | ICD-10-CM

## 2020-05-03 NOTE — Therapy (Signed)
[facility] [address] [place], [place], [address] Phone: [phone]   Fax:  [phone]  Physical Therapy Evaluation  Patient Details  Name: Rebecca Arellano MRN: [id number] Date of Birth: February 03, 1946 Referring Provider (PT): Julio Sicks   Encounter Date: 05/03/2020   PT End of Session - 05/03/20 0845    Visit Number 1    Number of Visits 12    Date for PT Re-Evaluation 06/14/20    Authorization Type UHC Medicare    Authorization Time Period no auth no VL    Progress Note Due on Visit 10    PT Start Time 0832    PT Stop Time 0906    PT Time Calculation (min) 34 min    Activity Tolerance Patient tolerated treatment well    Behavior During Therapy [facility] for tasks assessed/performed           Past Medical History:  Diagnosis Date  . Abnormal EKG   . Allergic rhinitis   . Asthma   . Chronic back pain   . DDD (degenerative disc disease), lumbar   . Degenerative lumbar disc   . Diabetes mellitus, type II (HCC)   . Goiter   . Heart murmur   . Hypertension   . Hypothyroidism   . Other and unspecified hyperlipidemia   . Reflux     Past Surgical History:  Procedure Laterality Date  . 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  . COLONOSCOPY  02/2010   Dr. Jena Gauss, left-sided diverticula, surveillance colonoscopy recommended for 5 years  . COLONOSCOPY WITH PROPOFOL N/A 08/06/2016   Procedure: COLONOSCOPY WITH PROPOFOL;  Surgeon: Corbin Ade, MD;  [facility];  Service: Endoscopy;  Laterality: N/A;  7:30 am    There were no vitals filed for this visit.    Subjective Assessment - 05/03/20 0839    Subjective States she has been having back problems for ages but it started getting worse. States she was having right leg pain over the last year. States that she has had MRIs. States that her MD said she had arthritis and bone on bone and they did an outpatient operation about 2 months  ago and now the pain in her left leg is gone but continues to have pain in her back and hips. Since surgery  this pain has stayed pretty consistent and tramadol  helps with the pain. States she can't stand for long periods of time (greater than  15 minutes). Reports when she is doing stuff around the house she will do short burst of activity and then she has to rest. States when she shops she uses a grocery cart like a walker. Current pain level no pain, but when she walked in pain was 2/10 and described as dull achy but sometimes sharp in hip.    Pertinent History DB, HTN, heart murmur    How long can you stand comfortably? 15 minutes    Patient Stated Goals would like to be able to stand an hour or two to be able to do dishes and clean house without having to take rest breaks    Currently in Pain? Yes    Pain Score 2     Pain Location Back    Pain Orientation Lower;Left;Right    Pain Descriptors / Indicators Aching;Sharp;Sore    Pain Radiating Towards to hips and across buttocks    Pain Onset More than a month ago  Pain Frequency Constant    Aggravating Factors  standing, walking    Pain Relieving Factors pain meds, sitting              [facility] PT Assessment - 05/03/20 0001      Assessment   Medical Diagnosis LBP    Referring Provider (PT) Julio Sicks    Next MD Visit 05/26/20    Prior Therapy no       Balance Screen   Has the patient fallen in the past 6 months No      Home Environment   Living Environment Private residence    Available Help at Discharge Family    Type of Home Mobile home    Home Access Stairs to enter    Entrance Stairs-Number of Steps 1    Home Layout One level    Home Equipment [place] - single point   used to use it no longer using it     Prior Function   Level of Independence Independent      Observation/Other Assessments   Observations incsion healing well, minor swelling noted around incision no signs of infection     Focus on Therapeutic Outcomes  (FOTO)  43.65 funciton       ROM / Strength   AROM / PROM / Strength AROM      AROM   AROM Assessment Site Lumbar    Lumbar Flexion 100% limited   all hip ROM    Lumbar Extension 100% limited   all knee ROM    Lumbar - Right Side Bend 75% limited   soreness on both sides   Lumbar - Left Side Bend 75% limited   soreness on both sides.    Lumbar - Right Rotation 75% limited    stretching   Lumbar - Left Rotation 75% limited    stretching     Palpation   Palpation comment tenderness to palpation along incision site      Special Tests   Other special tests ely's + bilateral and discomfort along front of thigh       Bed Mobility   Bed Mobility --   labored slow movemnets with all movements                     Objective measurements completed on examination: See above findings.       [facility] Adult PT Treatment/Exercise - 05/03/20 0001      Exercises   Exercises Lumbar      Lumbar Exercises: Stretches   Single Knee to Chest Stretch Left;Right;5 reps;10 seconds      Lumbar Exercises: Prone   Other Prone Lumbar Exercises knee bends x5 5" holds B                   PT Education - 05/03/20 0849    Education Details educated patient in gentle scar mobilization and surrounding area to help with swelling and pain. educated patient on POC, FOTO score and HEP.    Person(s) Educated Patient    Methods Explanation    Comprehension Verbalized understanding            PT Short Term Goals - 05/03/20 0902      PT SHORT TERM GOAL #1   Title Patient will be able to stand for at least 30 minutes to demonstrate improved standing endurance.    Time 3    Period Weeks    Status New    Target Date 05/24/20  PT SHORT TERM GOAL #2   Title Patient will be independent in self management strategies to improve quality of life and functional outcomes.    Time 3    Period Weeks    Status New    Target Date 05/24/20      PT SHORT TERM GOAL #3   Title Patient will  report at least 50% improvement in overall symptoms and/or function to demonstrate improved functional mobility    Time 3    Period Weeks    Status New    Target Date 05/24/20             PT Long Term Goals - 05/03/20 0903      PT LONG TERM GOAL #1   Title Patient will be able to stand for at least 45 minutes to demonstrate improved standing endurance.    Time 6    Period Weeks    Status New    Target Date 06/14/20      PT LONG TERM GOAL #2   Title Patient will report at least 50% improvement in overall symptoms and/or function to demonstrate improved functional mobility    Time 6    Period Weeks    Status New    Target Date 06/14/20      PT LONG TERM GOAL #3   Title Patient will improve on FOTO score to meet predicted outcomes to demonstrate improved functional mobility.    Time 6    Period Weeks    Status New    Target Date 06/14/20                  Plan - 05/03/20 0905    Clinical Impression Statement Patient presents to therapy with chronic low back pain that has been treated with injections but ultimately leg pain got too intense and patient elected to undergo outpatient lumbar surgery about 2 months ago. Since then her leg pain has resolved.  Patient presents with limitations in ROM and functional endurance that is affecting her ability to perform functional tasks around the home. Patient would greatly benefit from skilled physical therapy to improve functional mobility and return her to optimal function.    Personal Factors and Comorbidities Comorbidity 1;Comorbidity 2;Comorbidity 3+    Comorbidities HTN, DB, heart murmur, chronic neck pain    Examination-Activity Limitations Bed Mobility;Transfers;Stairs;Stand;Squat;Lift;Locomotion Level;Reach Overhead    Examination-Participation Restrictions Community Activity;Meal Prep;Laundry;Yard Work;Shop;Cleaning    Stability/Clinical Decision Making Stable/Uncomplicated    Clinical Decision Making Low    Rehab  Potential Good    PT Frequency 2x / week    PT Duration 6 weeks    PT Treatment/Interventions ADLs/Self Care Home Management;Aquatic Therapy;Functional mobility training;Patient/family education;Manual techniques;Therapeutic exercise;Therapeutic activities;Neuromuscular re-education;Dry needling;Passive range of motion;Gait training;Stair training;Cryotherapy;Electrical Stimulation;Traction;Moist Heat;Scar mobilization    PT Next Visit Plan lumbar and hip ROM, glute/tra activation, hip isometrics    PT Home Exercise Plan SKC, prone hamstring curl    Consulted and Agree with Plan of Care Patient           Patient will benefit from skilled therapeutic intervention in order to improve the following deficits and impairments:  Pain, Difficulty walking, Decreased mobility, Decreased range of motion, Increased edema, Decreased endurance, Decreased skin integrity, Decreased strength  Visit Diagnosis: Chronic bilateral low back pain without sciatica  Decreased range of motion of trunk and back  Difficulty in walking, not elsewhere classified     Problem List Patient Active Problem List   Diagnosis Date Noted  .  Pulmonary embolism (HCC) 08/12/2018  . DM (diabetes mellitus) (HCC) 08/12/2018  . HTN (hypertension) 08/12/2018  . Right leg DVT (HCC) 08/12/2018  . Pulmonary nodules 08/12/2018  . LUQ pain 07/09/2016  . LLQ pain 07/09/2016  . Chronic idiopathic constipation 07/09/2016  . FH: celiac disease 07/09/2016  . Murmur, cardiac 05/27/2013  . FATTY LIVER DISEASE 03/09/2010  . History of colonic polyps 03/09/2010   9:14 AM, 05/03/20 Tereasa CoopMichele Leva Baine, DPT [facility]  [phone] office  [facility] [address] [place], [place], [address] Phone: [phone]   Fax:  [phone]  Name: Rebecca Arellano MRN: [id number] Date of Birth: February 15, 1946

## 2020-05-10 ENCOUNTER — Encounter (HOSPITAL_COMMUNITY): Payer: Self-pay | Admitting: Physical Therapy

## 2020-05-10 ENCOUNTER — Ambulatory Visit (HOSPITAL_COMMUNITY): Payer: Medicare Other | Admitting: Physical Therapy

## 2020-05-10 ENCOUNTER — Other Ambulatory Visit: Payer: Self-pay

## 2020-05-10 DIAGNOSIS — M545 Low back pain, unspecified: Secondary | ICD-10-CM

## 2020-05-10 DIAGNOSIS — R262 Difficulty in walking, not elsewhere classified: Secondary | ICD-10-CM

## 2020-05-10 DIAGNOSIS — G8929 Other chronic pain: Secondary | ICD-10-CM

## 2020-05-10 DIAGNOSIS — M2569 Stiffness of other specified joint, not elsewhere classified: Secondary | ICD-10-CM

## 2020-05-10 NOTE — Patient Instructions (Signed)
Access Code: [email or web address] URL: [email or web address] Date: 05/10/2020 Prepared by: [facility] Muranda Coye  Exercises Beginner Bridge - 1 x daily - 7 x weekly - 2 sets - 10 reps

## 2020-05-10 NOTE — Therapy (Signed)
[facility] [address] [place], [place], [address] Phone: [phone]   Fax:  [phone]  Physical Therapy Treatment  Patient Details  Name: Rebecca Arellano MRN: [id number] Date of Birth: [birth year] Referring Provider (PT): Julio Sicks   Encounter Date: 05/10/2020   PT End of Session - 05/10/20 1131    Visit Number 2    Number of Visits 12    Date for PT Re-Evaluation 06/14/20    Authorization Type UHC Medicare    Authorization Time Period no auth no VL    Progress Note Due on Visit 10    PT Start Time 1132    PT Stop Time 1212    PT Time Calculation (min) 40 min    Activity Tolerance Patient tolerated treatment well    Behavior During Therapy [facility] for tasks assessed/performed           Past Medical History:  Diagnosis Date  . Abnormal EKG   . Allergic rhinitis   . Asthma   . Chronic back pain   . DDD (degenerative disc disease), lumbar   . Degenerative lumbar disc   . Diabetes mellitus, type II (HCC)   . Goiter   . Heart murmur   . Hypertension   . Hypothyroidism   . Other and unspecified hyperlipidemia   . Reflux     Past Surgical History:  Procedure Laterality Date  . 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  . COLONOSCOPY  02/2010   Dr. Jena Gauss, left-sided diverticula, surveillance colonoscopy recommended for 5 years  . COLONOSCOPY WITH PROPOFOL N/A 08/06/2016   Procedure: COLONOSCOPY WITH PROPOFOL;  Surgeon: Corbin Ade, MD;  [facility];  Service: Endoscopy;  Laterality: N/A;  7:30 am    There were no vitals filed for this visit.   Subjective Assessment - 05/10/20 1132    Subjective Patient states she is about the same. She did the exercise on her back with the stretching. She had trouble with the hamstring curls when laying her stomach because it hurts her back. She has not done her exercises every day.    Pertinent History DB, HTN, heart murmur    How long  can you stand comfortably? 15 minutes    Patient Stated Goals would like to be able to stand an hour or two to be able to do dishes and clean house without having to take rest breaks    Currently in Pain? Yes    Pain Score 1     Pain Location Back    Pain Onset More than a month ago                             [facility] Adult PT Treatment/Exercise - 05/10/20 0001      Lumbar Exercises: Stretches   Single Knee to Chest Stretch Left;Right;5 reps;10 seconds      Lumbar Exercises: Supine   Ab Set 10 reps;5 seconds    AB Set Limitations with exhale    Glut Set 10 reps;5 seconds    Bridge 10 reps    Bridge Limitations 2 sets    Other Supine Lumbar Exercises hip iso add/abd 10x 10 second holds      Lumbar Exercises: Prone   Other Prone Lumbar Exercises knee bends x5 5" holds B  PT Education - 05/10/20 1131    Education Details Patient educated on HEP, exercise mechanics    Person(s) Educated Patient    Methods Explanation;Demonstration    Comprehension Verbalized understanding;Returned demonstration            PT Short Term Goals - 05/03/20 0902      PT SHORT TERM GOAL #1   Title Patient will be able to stand for at least 30 minutes to demonstrate improved standing endurance.    Time 3    Period Weeks    Status New    Target Date 05/24/20      PT SHORT TERM GOAL #2   Title Patient will be independent in self management strategies to improve quality of life and functional outcomes.    Time 3    Period Weeks    Status New    Target Date 05/24/20      PT SHORT TERM GOAL #3   Title Patient will report at least 50% improvement in overall symptoms and/or function to demonstrate improved functional mobility    Time 3    Period Weeks    Status New    Target Date 05/24/20             PT Long Term Goals - 05/03/20 0903      PT LONG TERM GOAL #1   Title Patient will be able to stand for at least 45 minutes to demonstrate  improved standing endurance.    Time 6    Period Weeks    Status New    Target Date 06/14/20      PT LONG TERM GOAL #2   Title Patient will report at least 50% improvement in overall symptoms and/or function to demonstrate improved functional mobility    Time 6    Period Weeks    Status New    Target Date 06/14/20      PT LONG TERM GOAL #3   Title Patient will improve on FOTO score to meet predicted outcomes to demonstrate improved functional mobility.    Time 6    Period Weeks    Status New    Target Date 06/14/20                 Plan - 05/10/20 1132    Clinical Impression Statement Patient requires min verbal cueing for mechanics for [facility] and uses towel for ease of stretch. Patient tolerates hip isometrics without increase in symptoms. Patient has difficulty completing TRA activation in supine despite verbal and tactile cueing. Patient able to complete with exhale but unable to engage without exhale. Patient has c/o slight increase in LBP with bridge when lifting higher but is able to complete with cueing for prior glute activation and limiting height of lift. Patient requires verbal cueing for mechanics of prone hamstring curls and requires education on limiting propping up on arms due to increased hip pain with lumbar extension in prone. Patient will continue to benefit from skilled physical therapy in order to reduce impairment and improve function.    Personal Factors and Comorbidities Comorbidity 1;Comorbidity 2;Comorbidity 3+    Comorbidities HTN, DB, heart murmur, chronic neck pain    Examination-Activity Limitations Bed Mobility;Transfers;Stairs;Stand;Squat;Lift;Locomotion Level;Reach Overhead    Examination-Participation Restrictions Community Activity;Meal Prep;Laundry;Yard Work;Shop;Cleaning    Stability/Clinical Decision Making Stable/Uncomplicated    Rehab Potential Good    PT Frequency 2x / week    PT Duration 6 weeks    PT Treatment/Interventions ADLs/Self Care  Home Management;Aquatic  Therapy;Functional mobility training;Patient/family education;Manual techniques;Therapeutic exercise;Therapeutic activities;Neuromuscular re-education;Dry needling;Passive range of motion;Gait training;Stair training;Cryotherapy;Electrical Stimulation;Traction;Moist Heat;Scar mobilization    PT Next Visit Plan lumbar and hip ROM, glute/tra activation, hip isometrics    PT Home Exercise Plan SKC, prone hamstring curl 10/12 bridge    Consulted and Agree with Plan of Care Patient           Patient will benefit from skilled therapeutic intervention in order to improve the following deficits and impairments:  Pain, Difficulty walking, Decreased mobility, Decreased range of motion, Increased edema, Decreased endurance, Decreased skin integrity, Decreased strength  Visit Diagnosis: Chronic bilateral low back pain without sciatica  Decreased range of motion of trunk and back  Difficulty in walking, not elsewhere classified     Problem List Patient Active Problem List   Diagnosis Date Noted  . Pulmonary embolism (HCC) 08/12/2018  . DM (diabetes mellitus) (HCC) 08/12/2018  . HTN (hypertension) 08/12/2018  . Right leg DVT (HCC) 08/12/2018  . Pulmonary nodules 08/12/2018  . LUQ pain 07/09/2016  . LLQ pain 07/09/2016  . Chronic idiopathic constipation 07/09/2016  . FH: celiac disease 07/09/2016  . Murmur, cardiac 05/27/2013  . FATTY LIVER DISEASE 03/09/2010  . History of colonic polyps 03/09/2010    12:17 PM, 05/10/20 Wyman Songster PT, DPT Physical Therapist at [facility]  [facility] [address] [place], [place], [address] Phone: [phone]   Fax:  [phone]  Name: Rebecca Arellano MRN: [id number] Date of Birth: [birth year]

## 2020-05-12 ENCOUNTER — Encounter (HOSPITAL_COMMUNITY): Payer: Self-pay | Admitting: Physical Therapy

## 2020-05-12 ENCOUNTER — Ambulatory Visit (HOSPITAL_COMMUNITY): Payer: Medicare Other | Admitting: Physical Therapy

## 2020-05-12 ENCOUNTER — Other Ambulatory Visit: Payer: Self-pay

## 2020-05-12 DIAGNOSIS — M545 Low back pain, unspecified: Secondary | ICD-10-CM | POA: Diagnosis not present

## 2020-05-12 DIAGNOSIS — R262 Difficulty in walking, not elsewhere classified: Secondary | ICD-10-CM

## 2020-05-12 DIAGNOSIS — G8929 Other chronic pain: Secondary | ICD-10-CM

## 2020-05-12 DIAGNOSIS — M2569 Stiffness of other specified joint, not elsewhere classified: Secondary | ICD-10-CM

## 2020-05-12 NOTE — Therapy (Signed)
[facility] [address] [place], [place], [address] Phone: [phone]   Fax:  [phone]  Physical Therapy Treatment  Patient Details  Name: Rebecca Arellano MRN: [id number] Date of Birth: January 20, 1946 Referring Provider (PT): Julio Sicks   Encounter Date: 05/12/2020   PT End of Session - 05/12/20 0829    Visit Number 3    Number of Visits 12    Date for PT Re-Evaluation 06/14/20    Authorization Type UHC Medicare    Authorization Time Period no auth no VL    Progress Note Due on Visit 10    PT Start Time 0830    PT Stop Time 0908    PT Time Calculation (min) 38 min    Activity Tolerance Patient tolerated treatment well    Behavior During Therapy [facility] for tasks assessed/performed           Past Medical History:  Diagnosis Date  . Abnormal EKG   . Allergic rhinitis   . Asthma   . Chronic back pain   . DDD (degenerative disc disease), lumbar   . Degenerative lumbar disc   . Diabetes mellitus, type II (HCC)   . Goiter   . Heart murmur   . Hypertension   . Hypothyroidism   . Other and unspecified hyperlipidemia   . Reflux     Past Surgical History:  Procedure Laterality Date  . 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  . COLONOSCOPY  02/2010   Dr. Jena Gauss, left-sided diverticula, surveillance colonoscopy recommended for 5 years  . COLONOSCOPY WITH PROPOFOL N/A 08/06/2016   Procedure: COLONOSCOPY WITH PROPOFOL;  Surgeon: Corbin Ade, MD;  [facility];  Service: Endoscopy;  Laterality: N/A;  7:30 am    There were no vitals filed for this visit.       [facility] PT Assessment - 05/12/20 0001      Assessment   Medical Diagnosis LBP    Referring Provider (PT) Julio Sicks                         [facility] Adult PT Treatment/Exercise - 05/12/20 0001      Exercises   Exercises Knee/Hip      Lumbar Exercises: Stretches   Single Knee to Chest Stretch Left;Right;5  reps;10 seconds    Lower Trunk Rotation --   2x10 B 10" holds B    Other Lumbar Stretch Exercise bent knee fall outs x10 5" holds B       Lumbar Exercises: Standing   Other Standing Lumbar Exercises squats at counter - mini- 3x10       Lumbar Exercises: Supine   Bridge 2 seconds   4x5 - mini bridges     Knee/Hip Exercises: Standing   Forward Step Up 3 sets;5 reps;Hand Hold: 1;Step Height: 4";Both                  PT Education - 05/12/20 0841    Education Details Educated patient on anticipated post work out soreness but that it is unlikely that the exercises increased/ caused her pain as she felt fine after her last session and her pain actually presented the next day after standing all day. Discussed importance of doing exercises for benefits from HEP.    Person(s) Educated Patient    Methods Explanation    Comprehension Verbalized understanding  PT Short Term Goals - 05/03/20 0902      PT SHORT TERM GOAL #1   Title Patient will be able to stand for at least 30 minutes to demonstrate improved standing endurance.    Time 3    Period Weeks    Status New    Target Date 05/24/20      PT SHORT TERM GOAL #2   Title Patient will be independent in self management strategies to improve quality of life and functional outcomes.    Time 3    Period Weeks    Status New    Target Date 05/24/20      PT SHORT TERM GOAL #3   Title Patient will report at least 50% improvement in overall symptoms and/or function to demonstrate improved functional mobility    Time 3    Period Weeks    Status New    Target Date 05/24/20             PT Long Term Goals - 05/03/20 0903      PT LONG TERM GOAL #1   Title Patient will be able to stand for at least 45 minutes to demonstrate improved standing endurance.    Time 6    Period Weeks    Status New    Target Date 06/14/20      PT LONG TERM GOAL #2   Title Patient will report at least 50% improvement in overall symptoms  and/or function to demonstrate improved functional mobility    Time 6    Period Weeks    Status New    Target Date 06/14/20      PT LONG TERM GOAL #3   Title Patient will improve on FOTO score to meet predicted outcomes to demonstrate improved functional mobility.    Time 6    Period Weeks    Status New    Target Date 06/14/20                 Plan - 05/12/20 0830    Clinical Impression Statement Focused on educating patient on importance of HEP adherence to anticipate improvement in standing tolerance with increased strength and mobility. Discussed likely cause of pain was actually standing for long period of time yesterday as she didn't do her exercises and had no pain after last session. Added lumbar mobility exercises and patient uncertain with exercises secondary to pull with movements but no increase in reported pain/soreness that was reported at the start of session. Reassured patient and educated patient on how stretching and muscle activation can cause pulling and different feelings that are different from her pain. Re-printed exercises off for HEP adherence.    Personal Factors and Comorbidities Comorbidity 1;Comorbidity 2;Comorbidity 3+    Comorbidities HTN, DB, heart murmur, chronic neck pain    Examination-Activity Limitations Bed Mobility;Transfers;Stairs;Stand;Squat;Lift;Locomotion Level;Reach Overhead    Examination-Participation Restrictions Community Activity;Meal Prep;Laundry;Yard Work;Shop;Cleaning    Stability/Clinical Decision Making Stable/Uncomplicated    Rehab Potential Good    PT Frequency 2x / week    PT Duration 6 weeks    PT Treatment/Interventions ADLs/Self Care Home Management;Aquatic Therapy;Functional mobility training;Patient/family education;Manual techniques;Therapeutic exercise;Therapeutic activities;Neuromuscular re-education;Dry needling;Passive range of motion;Gait training;Stair training;Cryotherapy;Electrical Stimulation;Traction;Moist  Heat;Scar mobilization    PT Next Visit Plan lumbar and hip ROM, glute/tra activation, hip isometrics    PT Home Exercise Plan SKC, prone hamstring curl 10/12 bridge; 10/14 SKC, LTR    Consulted and Agree with Plan of Care Patient  Patient will benefit from skilled therapeutic intervention in order to improve the following deficits and impairments:  Pain, Difficulty walking, Decreased mobility, Decreased range of motion, Increased edema, Decreased endurance, Decreased skin integrity, Decreased strength  Visit Diagnosis: Chronic bilateral low back pain without sciatica  Decreased range of motion of trunk and back  Difficulty in walking, not elsewhere classified     Problem List Patient Active Problem List   Diagnosis Date Noted  . Pulmonary embolism (HCC) 08/12/2018  . DM (diabetes mellitus) (HCC) 08/12/2018  . HTN (hypertension) 08/12/2018  . Right leg DVT (HCC) 08/12/2018  . Pulmonary nodules 08/12/2018  . LUQ pain 07/09/2016  . LLQ pain 07/09/2016  . Chronic idiopathic constipation 07/09/2016  . FH: celiac disease 07/09/2016  . Murmur, cardiac 05/27/2013  . FATTY LIVER DISEASE 03/09/2010  . History of colonic polyps 03/09/2010   9:10 AM, 05/12/20 Tereasa Coop, DPT Physical Therapy with [facility]  [phone] office  [facility] [address] [place], [place], [address] Phone: [phone]   Fax:  [phone]  Name: Rebecca Arellano MRN: [id number] Date of Birth: Jan 31, 1946

## 2020-05-17 ENCOUNTER — Encounter (HOSPITAL_COMMUNITY): Payer: Self-pay

## 2020-05-17 ENCOUNTER — Ambulatory Visit (HOSPITAL_COMMUNITY): Payer: Medicare Other

## 2020-05-17 ENCOUNTER — Other Ambulatory Visit: Payer: Self-pay

## 2020-05-17 DIAGNOSIS — G8929 Other chronic pain: Secondary | ICD-10-CM

## 2020-05-17 DIAGNOSIS — R262 Difficulty in walking, not elsewhere classified: Secondary | ICD-10-CM

## 2020-05-17 DIAGNOSIS — M2569 Stiffness of other specified joint, not elsewhere classified: Secondary | ICD-10-CM

## 2020-05-17 DIAGNOSIS — M545 Low back pain, unspecified: Secondary | ICD-10-CM | POA: Diagnosis not present

## 2020-05-17 NOTE — Therapy (Signed)
[facility] [address] [place], [place], [address] Phone: [phone]   Fax:  [phone]  Physical Therapy Treatment  Patient Details  Name: Rebecca Arellano MRN: [id number] Date of Birth: 01-Feb-1946 Referring Provider (PT): Julio Sicks   Encounter Date: 05/17/2020   PT End of Session - 05/17/20 1453    Visit Number 4    Number of Visits 12    Date for PT Re-Evaluation 06/14/20    Authorization Type UHC Medicare    Authorization Time Period no auth no VL    Progress Note Due on Visit 10    PT Start Time 1445    PT Stop Time 1527    PT Time Calculation (min) 42 min    Activity Tolerance Patient tolerated treatment well    Behavior During Therapy [facility] for tasks assessed/performed           Past Medical History:  Diagnosis Date  . Abnormal EKG   . Allergic rhinitis   . Asthma   . Chronic back pain   . DDD (degenerative disc disease), lumbar   . Degenerative lumbar disc   . Diabetes mellitus, type II (HCC)   . Goiter   . Heart murmur   . Hypertension   . Hypothyroidism   . Other and unspecified hyperlipidemia   . Reflux     Past Surgical History:  Procedure Laterality Date  . 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  . COLONOSCOPY  02/2010   Dr. Jena Gauss, left-sided diverticula, surveillance colonoscopy recommended for 5 years  . COLONOSCOPY WITH PROPOFOL N/A 08/06/2016   Procedure: COLONOSCOPY WITH PROPOFOL;  Surgeon: Corbin Ade, MD;  [facility];  Service: Endoscopy;  Laterality: N/A;  7:30 am    There were no vitals filed for this visit.   Subjective Assessment - 05/17/20 1448    Subjective Reports Rt hip is a little sore, has been shopping at [facility] [employer] for a couple hours and didn't push the cart with success.  Reports she had some soreness at the end of walking for long periods  Has began her HEP daily, reports she doesn't do the prone exercises at  home.    Pertinent History DB, HTN, heart murmur    Patient Stated Goals would like to be able to stand an hour or two to be able to do dishes and clean house without having to take rest breaks    Currently in Pain? No/denies                             [facility] Adult PT Treatment/Exercise - 05/17/20 0001      Bed Mobility   Bed Mobility Sit to Sidelying Left   educated log rolling   Sit to Sidelying Left Supervision/Verbal cueing      Exercises   Exercises Knee/Hip      Lumbar Exercises: Stretches   Single Knee to Chest Stretch 3 reps;30 seconds    Lower Trunk Rotation 5 reps;10 seconds      Lumbar Exercises: Standing   Functional Squats 10 reps    Functional Squats Limitations mini squat with HHA 2 sets    Other Standing Lumbar Exercises 3D hip excursion (lateral shift, minisquat with HHA, lumbar extension and hip rotation)    Other Standing Lumbar Exercises abduction 2x 5reps      Lumbar Exercises: Supine   Clam 10  reps;5 seconds    Clam Limitations with ab set for stability    Bridge 10 reps;5 seconds    Bridge Limitations decreased range 2 sets                    PT Short Term Goals - 05/03/20 0902      PT SHORT TERM GOAL #1   Title Patient will be able to stand for at least 30 minutes to demonstrate improved standing endurance.    Time 3    Period Weeks    Status New    Target Date 05/24/20      PT SHORT TERM GOAL #2   Title Patient will be independent in self management strategies to improve quality of life and functional outcomes.    Time 3    Period Weeks    Status New    Target Date 05/24/20      PT SHORT TERM GOAL #3   Title Patient will report at least 50% improvement in overall symptoms and/or function to demonstrate improved functional mobility    Time 3    Period Weeks    Status New    Target Date 05/24/20             PT Long Term Goals - 05/03/20 0903      PT LONG TERM GOAL #1   Title Patient will be able to  stand for at least 45 minutes to demonstrate improved standing endurance.    Time 6    Period Weeks    Status New    Target Date 06/14/20      PT LONG TERM GOAL #2   Title Patient will report at least 50% improvement in overall symptoms and/or function to demonstrate improved functional mobility    Time 6    Period Weeks    Status New    Target Date 06/14/20      PT LONG TERM GOAL #3   Title Patient will improve on FOTO score to meet predicted outcomes to demonstrate improved functional mobility.    Time 6    Period Weeks    Status New    Target Date 06/14/20                 Plan - 05/17/20 1454    Clinical Impression Statement Educated log rolling to reduce strain on back, min cueing for form and mechanics.  Pt able to recall and demonstrate appropriate form wiht supine exercises.  Education complete for length of time to hold stretches and encouraged to hold for longer duration for musculature lengthening.  Continued session focus with lumbar and hip mobilty for pain control and hip strengthening.  Added 3D hip excursion for mobility.  Reports of soreness reduced with increased reps.    Personal Factors and Comorbidities Comorbidity 1;Comorbidity 2;Comorbidity 3+;Age    Comorbidities HTN, DB, heart murmur, chronic neck pain    Examination-Activity Limitations Bed Mobility;Transfers;Stairs;Stand;Squat;Lift;Locomotion Level;Reach Overhead    Examination-Participation Restrictions Community Activity;Meal Prep;Laundry;Yard Work;Shop;Cleaning    Stability/Clinical Decision Making Stable/Uncomplicated    Rehab Potential Good    PT Frequency 2x / week    PT Duration 6 weeks    PT Treatment/Interventions ADLs/Self Care Home Management;Aquatic Therapy;Functional mobility training;Patient/family education;Manual techniques;Therapeutic exercise;Therapeutic activities;Neuromuscular re-education;Dry needling;Passive range of motion;Gait training;Stair training;Cryotherapy;Electrical  Stimulation;Traction;Moist Heat;Scar mobilization    PT Next Visit Plan lumbar and hip ROM, glute/tra activation, hip isometrics    PT Home Exercise Plan SKC, prone hamstring curl 10/12 bridge;  10/14 SKC, LTR           Patient will benefit from skilled therapeutic intervention in order to improve the following deficits and impairments:  Pain, Difficulty walking, Decreased mobility, Decreased range of motion, Increased edema, Decreased endurance, Decreased skin integrity, Decreased strength  Visit Diagnosis: Chronic bilateral low back pain without sciatica  Decreased range of motion of trunk and back  Difficulty in walking, not elsewhere classified     Problem List Patient Active Problem List   Diagnosis Date Noted  . Pulmonary embolism (HCC) 08/12/2018  . DM (diabetes mellitus) (HCC) 08/12/2018  . HTN (hypertension) 08/12/2018  . Right leg DVT (HCC) 08/12/2018  . Pulmonary nodules 08/12/2018  . LUQ pain 07/09/2016  . LLQ pain 07/09/2016  . Chronic idiopathic constipation 07/09/2016  . FH: celiac disease 07/09/2016  . Murmur, cardiac 05/27/2013  . FATTY LIVER DISEASE 03/09/2010  . History of colonic polyps 03/09/2010   Becky Sax, LPTA/CLT; CBIS [phone]  Juel Burrow 05/17/2020, 3:30 PM  [facility] [address] [place], [place], [address] Phone: [phone]   Fax:  [phone]  Name: BRIAHNA PESCADOR MRN: [id number] Date of Birth: 12/31/1945

## 2020-05-18 ENCOUNTER — Ambulatory Visit (HOSPITAL_COMMUNITY): Payer: Medicare Other

## 2020-05-18 ENCOUNTER — Encounter (HOSPITAL_COMMUNITY): Payer: Self-pay

## 2020-05-18 DIAGNOSIS — M545 Low back pain, unspecified: Secondary | ICD-10-CM | POA: Diagnosis not present

## 2020-05-18 DIAGNOSIS — R262 Difficulty in walking, not elsewhere classified: Secondary | ICD-10-CM

## 2020-05-18 DIAGNOSIS — G8929 Other chronic pain: Secondary | ICD-10-CM

## 2020-05-18 DIAGNOSIS — M2569 Stiffness of other specified joint, not elsewhere classified: Secondary | ICD-10-CM

## 2020-05-18 NOTE — Therapy (Addendum)
[facility] [address] [place], [place], [address] Phone: [phone]   Fax:  [phone]  Physical Therapy Treatment  Patient Details  Name: Rebecca Arellano MRN: [id number] Date of Birth: 1946/06/17 Referring Provider (PT): Julio Sicks   Encounter Date: 05/18/2020   PT End of Session - 05/18/20 1140    Visit Number 5    Number of Visits 12    Date for PT Re-Evaluation 06/14/20    Authorization Type UHC Medicare    Authorization Time Period no auth no VL    Progress Note Due on Visit 10    PT Start Time 1132    PT Stop Time 1210    PT Time Calculation (min) 38 min    Activity Tolerance Patient tolerated treatment well    Behavior During Therapy [facility] for tasks assessed/performed           Past Medical History:  Diagnosis Date  . Abnormal EKG   . Allergic rhinitis   . Asthma   . Chronic back pain   . DDD (degenerative disc disease), lumbar   . Degenerative lumbar disc   . Diabetes mellitus, type II (HCC)   . Goiter   . Heart murmur   . Hypertension   . Hypothyroidism   . Other and unspecified hyperlipidemia   . Reflux     Past Surgical History:  Procedure Laterality Date  . 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  . COLONOSCOPY  02/2010   Dr. Jena Gauss, left-sided diverticula, surveillance colonoscopy recommended for 5 years  . COLONOSCOPY WITH PROPOFOL N/A 08/06/2016   Procedure: COLONOSCOPY WITH PROPOFOL;  Surgeon: Corbin Ade, MD;  [facility];  Service: Endoscopy;  Laterality: N/A;  7:30 am    There were no vitals filed for this visit.   Subjective Assessment - 05/18/20 1138    Subjective Pt reports she has some soreness Rt hip and c/o cramp in Rt calf that arrived last night.  Pain scale 2/10 Rt hip    Patient Stated Goals would like to be able to stand an hour or two to be able to do dishes and clean house without having to take rest breaks    Currently in Pain?  Yes    Pain Score 2     Pain Location Hip    Pain Orientation Right    Pain Descriptors / Indicators Aching;Sore    Pain Onset More than a month ago    Pain Frequency Constant    Aggravating Factors  standing, walking    Pain Relieving Factors pain meds, sitting                             [facility] Adult PT Treatment/Exercise - 05/18/20 0001      Lumbar Exercises: Stretches   Single Knee to Chest Stretch 3 reps;30 seconds    Lower Trunk Rotation 5 reps;10 seconds      Lumbar Exercises: Standing   Heel Raises 10 reps    Functional Squats 10 reps    Functional Squats Limitations mini squat with HHA 2 sets    Other Standing Lumbar Exercises 3D hip excursion (lateral shift, minisquat with HHA, lumbar extension and hip rotation)    Other Standing Lumbar Exercises Marching 10x 5" holds with ab set; SLS 5" max      Lumbar Exercises: Supine   Clam 10 reps;5 seconds  Clam Limitations with ab set for stability    Bent Knee Raise 10 reps;5 seconds    Bent Knee Raise Limitations with ab set    Bridge 10 reps;5 seconds    Bridge Limitations decreased range 2 sets                  PT Education - 05/18/20 1304    Education Details Reviewed s/s of DVT    Person(s) Educated Patient    Methods Explanation    Comprehension Verbalized understanding            PT Short Term Goals - 05/03/20 0902      PT SHORT TERM GOAL #1   Title Patient will be able to stand for at least 30 minutes to demonstrate improved standing endurance.    Time 3    Period Weeks    Status New    Target Date 05/24/20      PT SHORT TERM GOAL #2   Title Patient will be independent in self management strategies to improve quality of life and functional outcomes.    Time 3    Period Weeks    Status New    Target Date 05/24/20      PT SHORT TERM GOAL #3   Title Patient will report at least 50% improvement in overall symptoms and/or function to demonstrate improved functional mobility     Time 3    Period Weeks    Status New    Target Date 05/24/20             PT Long Term Goals - 05/03/20 0903      PT LONG TERM GOAL #1   Title Patient will be able to stand for at least 45 minutes to demonstrate improved standing endurance.    Time 6    Period Weeks    Status New    Target Date 06/14/20      PT LONG TERM GOAL #2   Title Patient will report at least 50% improvement in overall symptoms and/or function to demonstrate improved functional mobility    Time 6    Period Weeks    Status New    Target Date 06/14/20      PT LONG TERM GOAL #3   Title Patient will improve on FOTO score to meet predicted outcomes to demonstrate improved functional mobility.    Time 6    Period Weeks    Status New    Target Date 06/14/20                 Plan - 05/18/20 1301    Clinical Impression Statement Session focus with hip/lumbar mobility and core strengthening for lumbar support.  Verbal cueing to improve stability through session.  Pt concerned with calf cramping as history of DVT.  No redness, heat, swelling or pain during homan's sign testing.  Reviewed symptoms and encouraged pt to seek care if symptoms occur.    Personal Factors and Comorbidities Comorbidity 1;Comorbidity 2;Comorbidity 3+;Age    Comorbidities HTN, DB, heart murmur, chronic neck pain    Examination-Activity Limitations Bed Mobility;Transfers;Stairs;Stand;Squat;Lift;Locomotion Level;Reach Overhead    Examination-Participation Restrictions Community Activity;Meal Prep;Laundry;Yard Work;Shop;Cleaning    Stability/Clinical Decision Making Stable/Uncomplicated    Clinical Decision Making Low    Rehab Potential Good    PT Frequency 2x / week    PT Duration 6 weeks    PT Treatment/Interventions ADLs/Self Care Home Management;Aquatic Therapy;Functional mobility training;Patient/family education;Manual techniques;Therapeutic exercise;Therapeutic activities;Neuromuscular re-education;Dry needling;Passive  range of motion;Gait training;Stair training;Cryotherapy;Electrical Stimulation;Traction;Moist Heat;Scar mobilization    PT Next Visit Plan lumbar and hip ROM, glute/tra activation, hip isometrics.  Educated on benefits iwth compressoin hose.    PT Home Exercise Plan SKC, prone hamstring curl 10/12 bridge; 10/14 SKC, LTR           Patient will benefit from skilled therapeutic intervention in order to improve the following deficits and impairments:  Pain, Difficulty walking, Decreased mobility, Decreased range of motion, Increased edema, Decreased endurance, Decreased skin integrity, Decreased strength  Visit Diagnosis: Chronic bilateral low back pain without sciatica  Decreased range of motion of trunk and back  Difficulty in walking, not elsewhere classified     Problem List Patient Active Problem List   Diagnosis Date Noted  . Pulmonary embolism (HCC) 08/12/2018  . DM (diabetes mellitus) (HCC) 08/12/2018  . HTN (hypertension) 08/12/2018  . Right leg DVT (HCC) 08/12/2018  . Pulmonary nodules 08/12/2018  . LUQ pain 07/09/2016  . LLQ pain 07/09/2016  . Chronic idiopathic constipation 07/09/2016  . FH: celiac disease 07/09/2016  . Murmur, cardiac 05/27/2013  . FATTY LIVER DISEASE 03/09/2010  . History of colonic polyps 03/09/2010   Becky Sax, LPTA/CLT; CBIS [phone]   Juel Burrow 05/18/2020, 4:07 PM  [facility] [address] [place], [place], [address] Phone: [phone]   Fax:  [phone]  Name: RABECKA BRENDEL MRN: [id number] Date of Birth: 04/11/1946

## 2020-05-20 ENCOUNTER — Encounter (HOSPITAL_COMMUNITY): Payer: Medicare Other

## 2020-05-24 ENCOUNTER — Encounter (HOSPITAL_COMMUNITY): Payer: Self-pay | Admitting: Physical Therapy

## 2020-05-24 ENCOUNTER — Ambulatory Visit (HOSPITAL_COMMUNITY): Payer: Medicare Other | Admitting: Physical Therapy

## 2020-05-24 ENCOUNTER — Other Ambulatory Visit: Payer: Self-pay

## 2020-05-24 DIAGNOSIS — R262 Difficulty in walking, not elsewhere classified: Secondary | ICD-10-CM

## 2020-05-24 DIAGNOSIS — M2569 Stiffness of other specified joint, not elsewhere classified: Secondary | ICD-10-CM

## 2020-05-24 DIAGNOSIS — M545 Low back pain, unspecified: Secondary | ICD-10-CM | POA: Diagnosis not present

## 2020-05-24 NOTE — Therapy (Signed)
[facility] [address] [place], [place], [address] Phone: [phone]   Fax:  [phone]  Physical Therapy Treatment  Patient Details  Name: Rebecca Arellano MRN: [id number] Date of Birth: Jan 19, 1946 Referring Provider (PT): Julio Sicks   Encounter Date: 05/24/2020   PT End of Session - 05/24/20 1305    Visit Number 6    Number of Visits 12    Date for PT Re-Evaluation 06/14/20    Authorization Type UHC Medicare    Authorization Time Period no auth no VL    Progress Note Due on Visit 10    PT Start Time 1315    PT Stop Time 1355    PT Time Calculation (min) 40 min    Activity Tolerance Patient tolerated treatment well    Behavior During Therapy [facility] for tasks assessed/performed           Past Medical History:  Diagnosis Date  . Abnormal EKG   . Allergic rhinitis   . Asthma   . Chronic back pain   . DDD (degenerative disc disease), lumbar   . Degenerative lumbar disc   . Diabetes mellitus, type II (HCC)   . Goiter   . Heart murmur   . Hypertension   . Hypothyroidism   . Other and unspecified hyperlipidemia   . Reflux     Past Surgical History:  Procedure Laterality Date  . 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  . COLONOSCOPY  02/2010   Dr. Jena Gauss, left-sided diverticula, surveillance colonoscopy recommended for 5 years  . COLONOSCOPY WITH PROPOFOL N/A 08/06/2016   Procedure: COLONOSCOPY WITH PROPOFOL;  Surgeon: Corbin Ade, MD;  [facility];  Service: Endoscopy;  Laterality: N/A;  7:30 am    There were no vitals filed for this visit.   Subjective Assessment - 05/24/20 1320    Subjective States that her hips have been sore for the last couple of days more on the right side then the left. Currently it is 5/10 soreness. States she has no difficulties with her exercises.    Patient Stated Goals would like to be able to stand an hour or two to be able to do dishes and  clean house without having to take rest breaks    Currently in Pain? Yes    Pain Score 5     Pain Location Hip    Pain Orientation Right    Pain Descriptors / Indicators Aching;Sore    Pain Onset More than a month ago              [facility] PT Assessment - 05/24/20 0001      Assessment   Medical Diagnosis LBP    Referring Provider (PT) Julio Sicks    Next MD Visit 06/07/20                         [facility] Adult PT Treatment/Exercise - 05/24/20 0001      Lumbar Exercises: Standing   Other Standing Lumbar Exercises self massage with tennis ball lumbar and gluteal muscles - to tolerance - 6 minutes      Lumbar Exercises: Seated   Sit to Stand 20 reps   as slowly as possible    Other Seated Lumbar Exercises lumbar flexion 10- 5" holds       Lumbar Exercises: Supine   Bridge 20 reps;5 seconds   decreased range of motion  Other Supine Lumbar Exercises sciatic nerve glide 2x5 B with 3 DF/PF                  PT Education - 05/24/20 1327    Education Details on current condition, rationale for exercises, anatomy    Person(s) Educated Patient    Methods Explanation    Comprehension Verbalized understanding            PT Short Term Goals - 05/03/20 0902      PT SHORT TERM GOAL #1   Title Patient will be able to stand for at least 30 minutes to demonstrate improved standing endurance.    Time 3    Period Weeks    Status New    Target Date 05/24/20      PT SHORT TERM GOAL #2   Title Patient will be independent in self management strategies to improve quality of life and functional outcomes.    Time 3    Period Weeks    Status New    Target Date 05/24/20      PT SHORT TERM GOAL #3   Title Patient will report at least 50% improvement in overall symptoms and/or function to demonstrate improved functional mobility    Time 3    Period Weeks    Status New    Target Date 05/24/20             PT Long Term Goals - 05/03/20 0903      PT LONG TERM  GOAL #1   Title Patient will be able to stand for at least 45 minutes to demonstrate improved standing endurance.    Time 6    Period Weeks    Status New    Target Date 06/14/20      PT LONG TERM GOAL #2   Title Patient will report at least 50% improvement in overall symptoms and/or function to demonstrate improved functional mobility    Time 6    Period Weeks    Status New    Target Date 06/14/20      PT LONG TERM GOAL #3   Title Patient will improve on FOTO score to meet predicted outcomes to demonstrate improved functional mobility.    Time 6    Period Weeks    Status New    Target Date 06/14/20                 Plan - 05/24/20 1353    Clinical Impression Statement Continued reports of discomfort along back and hip. Difficult for patient to perform bed mobilities to get into position for [facility] stretch so exercise was not performed. Difficult for patient to report  exercises that help/make symptoms better after an exercise is executed. End of session patient reported she felt like she had stretched her lumbar spine and reported no hip symptoms end of session.    Personal Factors and Comorbidities Comorbidity 1;Comorbidity 2;Comorbidity 3+;Age    Comorbidities HTN, DB, heart murmur, chronic neck pain    Examination-Activity Limitations Bed Mobility;Transfers;Stairs;Stand;Squat;Lift;Locomotion Level;Reach Overhead    Examination-Participation Restrictions Community Activity;Meal Prep;Laundry;Yard Work;Shop;Cleaning    Stability/Clinical Decision Making Stable/Uncomplicated    Rehab Potential Good    PT Frequency 2x / week    PT Duration 6 weeks    PT Treatment/Interventions ADLs/Self Care Home Management;Aquatic Therapy;Functional mobility training;Patient/family education;Manual techniques;Therapeutic exercise;Therapeutic activities;Neuromuscular re-education;Dry needling;Passive range of motion;Gait training;Stair training;Cryotherapy;Electrical Stimulation;Traction;Moist  Heat;Scar mobilization    PT Next Visit Plan lumbar and hip ROM, glute/tra  activation, hip isometrics.  Educated on benefits iwth compressoin hose.    PT Home Exercise Plan SKC, prone hamstring curl 10/12 bridge; 10/14 SKC, LTR           Patient will benefit from skilled therapeutic intervention in order to improve the following deficits and impairments:  Pain, Difficulty walking, Decreased mobility, Decreased range of motion, Increased edema, Decreased endurance, Decreased skin integrity, Decreased strength  Visit Diagnosis: Chronic bilateral low back pain without sciatica  Decreased range of motion of trunk and back  Difficulty in walking, not elsewhere classified     Problem List Patient Active Problem List   Diagnosis Date Noted  . Pulmonary embolism (HCC) 08/12/2018  . DM (diabetes mellitus) (HCC) 08/12/2018  . HTN (hypertension) 08/12/2018  . Right leg DVT (HCC) 08/12/2018  . Pulmonary nodules 08/12/2018  . LUQ pain 07/09/2016  . LLQ pain 07/09/2016  . Chronic idiopathic constipation 07/09/2016  . FH: celiac disease 07/09/2016  . Murmur, cardiac 05/27/2013  . FATTY LIVER DISEASE 03/09/2010  . History of colonic polyps 03/09/2010   1:56 PM, 05/24/20 Tereasa Coop, DPT Physical Therapy with [facility]  [phone] office  [facility] [address] [place], [place], [address] Phone: [phone]   Fax:  [phone]  Name: Rebecca Arellano MRN: [id number] Date of Birth: Jun 27, 1946

## 2020-05-25 ENCOUNTER — Ambulatory Visit (HOSPITAL_COMMUNITY): Payer: Medicare Other | Admitting: Physical Therapy

## 2020-05-25 ENCOUNTER — Encounter (HOSPITAL_COMMUNITY): Payer: Self-pay | Admitting: Physical Therapy

## 2020-05-25 DIAGNOSIS — R262 Difficulty in walking, not elsewhere classified: Secondary | ICD-10-CM

## 2020-05-25 DIAGNOSIS — G8929 Other chronic pain: Secondary | ICD-10-CM

## 2020-05-25 DIAGNOSIS — M545 Low back pain, unspecified: Secondary | ICD-10-CM | POA: Diagnosis not present

## 2020-05-25 DIAGNOSIS — M2569 Stiffness of other specified joint, not elsewhere classified: Secondary | ICD-10-CM

## 2020-05-25 NOTE — Therapy (Signed)
[facility] [address] [place], [place], [address] Phone: [phone]   Fax:  [phone]  Physical Therapy Treatment and progress Note  Patient Details  Name: Rebecca Arellano MRN: [id number] Date of Birth: 06/11/1946 Referring Provider (PT): Earnie Larsson   Progress Note Reporting Period 05/03/20 to 05/25/20  See note below for Objective Data and Assessment of Progress/Goals.        Encounter Date: 05/25/2020   PT End of Session - 05/25/20 0913    Visit Number 7    Number of Visits 12    Date for PT Re-Evaluation 06/14/20    Authorization Type UHC Medicare    Authorization Time Period no auth no VL    Progress Note Due on Visit 17    PT Start Time 0915    PT Stop Time 0955    PT Time Calculation (min) 40 min    Activity Tolerance Patient tolerated treatment well    Behavior During Therapy [facility] for tasks assessed/performed           Past Medical History:  Diagnosis Date  . Abnormal EKG   . Allergic rhinitis   . Asthma   . Chronic back pain   . DDD (degenerative disc disease), lumbar   . Degenerative lumbar disc   . Diabetes mellitus, type II ([place])   . Goiter   . Heart murmur   . Hypertension   . Hypothyroidism   . Other and unspecified hyperlipidemia   . Reflux     Past Surgical History:  Procedure Laterality Date  . 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  . COLONOSCOPY  02/2010   Dr. Gala Romney, left-sided diverticula, surveillance colonoscopy recommended for 5 years  . COLONOSCOPY WITH PROPOFOL N/A 08/06/2016   Procedure: COLONOSCOPY WITH PROPOFOL;  Surgeon: Daneil Dolin, MD;  [facility];  Service: Endoscopy;  Laterality: N/A;  7:30 am    There were no vitals filed for this visit.   Subjective Assessment - 05/25/20 0931    Subjective States she feels better today. She was a little sore after yesterday's session.    Patient Stated Goals would like to be able  to stand an hour or two to be able to do dishes and clean house without having to take rest breaks    Currently in Pain? Yes    Pain Score 1     Pain Location Hip    Pain Orientation Right    Pain Descriptors / Indicators Aching;Sore    Pain Onset More than a month ago              [facility] PT Assessment - 05/25/20 0001      Assessment   Medical Diagnosis LBP    Referring Provider (PT) Earnie Larsson    Next MD Visit 06/07/20      Observation/Other Assessments   Focus on Therapeutic Outcomes (FOTO)  61% function - was 44% function       AROM   Lumbar Flexion 75% limited    pulling    Lumbar Extension 75% limited   pulling    Lumbar - Right Side Bend 50% limited   stretching   Lumbar - Left Side Bend 50% limited   stretching   Lumbar - Right Rotation 50% limited   pulling   Lumbar - Left Rotation 50% limited   pulling  [place] Adult PT Treatment/Exercise - 05/25/20 0001      Lumbar Exercises: Standing   Other Standing Lumbar Exercises lateral stepping with red TB 2x5 with 5 second hold at end - performed bilaterally       Knee/Hip Exercises: Standing   Forward Step Up Both;3 sets;5 reps;Hand Hold: 1;Step Height: 4"      Knee/Hip Exercises: Seated   Sit to Sand 3 sets;10 reps;without UE support   slow and controlled                 PT Education - 05/25/20 0930    Education Details on FOTO score, on HEP and importance continued adherence to HEP, typical rehab progression, on goals    Person(s) Educated Patient    Methods Explanation    Comprehension Verbalized understanding            PT Short Term Goals - 05/25/20 2831      PT SHORT TERM GOAL #1   Title Patient will be able to stand for at least 30 minutes to demonstrate improved standing endurance.    Baseline at least 10 minutes    Time 3    Period Weeks    Status On-going    Target Date 05/24/20      PT SHORT TERM GOAL #2   Title Patient will be independent in self  management strategies to improve quality of life and functional outcomes.    Baseline doing exercises daily    Time 3    Period Weeks    Status Achieved    Target Date 05/24/20      PT SHORT TERM GOAL #3   Title --    Baseline --    Time --    Period --    Status --    Target Date --             PT Long Term Goals - 05/25/20 5176      PT LONG TERM GOAL #1   Title Patient will be able to stand for at least 45 minutes to demonstrate improved standing endurance.    Time 6    Period Weeks    Status On-going      PT LONG TERM GOAL #2   Title Patient will report at least 50% improvement in overall symptoms and/or function to demonstrate improved functional mobility    Time 6    Period Weeks    Status On-going      PT LONG TERM GOAL #3   Title Patient will improve on FOTO score to meet predicted outcomes to demonstrate improved functional mobility.    Baseline was 44% function ---> 61% function    Time 6    Period Weeks    Status Achieved                 Plan - 05/25/20 0950    Clinical Impression Statement Educated patient on progress with FOTO score and goals. Added lateral stepping with anchored band. Educated patient on timing standing activities at home to determine progress and standing endurance at this time. Will continue with functional strength as tolerated . 1 short and 1 long term goal met at this time. Patient unsure of length of time she can stand at home, instructed patient to time this over the weekend. Overall patient is progressing well towards goals.    Personal Factors and Comorbidities Comorbidity 1;Comorbidity 2;Comorbidity 3+;Age    Comorbidities HTN, DB, heart murmur, chronic neck pain  Examination-Activity Limitations Bed Mobility;Transfers;Stairs;Stand;Squat;Lift;Locomotion Level;Reach Overhead    Examination-Participation Restrictions Community Activity;Meal Prep;Laundry;Yard Work;Shop;Cleaning    Stability/Clinical Decision Making  Stable/Uncomplicated    Rehab Potential Good    PT Frequency 2x / week    PT Duration 6 weeks    PT Treatment/Interventions ADLs/Self Care Home Management;Aquatic Therapy;Functional mobility training;Patient/family education;Manual techniques;Therapeutic exercise;Therapeutic activities;Neuromuscular re-education;Dry needling;Passive range of motion;Gait training;Stair training;Cryotherapy;Electrical Stimulation;Traction;Moist Heat;Scar mobilization    PT Next Visit Plan standing strengthening exerciess as tolerated    PT Home Exercise Plan SKC, prone hamstring curl 10/12 bridge; 10/14 SKC, LTR           Patient will benefit from skilled therapeutic intervention in order to improve the following deficits and impairments:  Pain, Difficulty walking, Decreased mobility, Decreased range of motion, Increased edema, Decreased endurance, Decreased skin integrity, Decreased strength  Visit Diagnosis: Chronic bilateral low back pain without sciatica  Decreased range of motion of trunk and back  Difficulty in walking, not elsewhere classified     Problem List Patient Active Problem List   Diagnosis Date Noted  . Pulmonary embolism ([place]) 08/12/2018  . DM (diabetes mellitus) ([place]) 08/12/2018  . HTN (hypertension) 08/12/2018  . Right leg DVT ([place]) 08/12/2018  . Pulmonary nodules 08/12/2018  . LUQ pain 07/09/2016  . LLQ pain 07/09/2016  . Chronic idiopathic constipation 07/09/2016  . FH: celiac disease 07/09/2016  . Murmur, cardiac 05/27/2013  . FATTY LIVER DISEASE 03/09/2010  . History of colonic polyps 03/09/2010   9:55 AM, 05/25/20 Jerene Pitch, DPT Physical Therapy with [facility]  [phone] office  [place] [address] [place], [place], [address] Phone: [phone]   Fax:  [phone]  Name: TRENISE TURAY MRN: [id number] Date of Birth: 05/03/1946

## 2020-05-26 ENCOUNTER — Encounter (HOSPITAL_COMMUNITY): Payer: Medicare Other | Admitting: Physical Therapy

## 2020-05-31 ENCOUNTER — Ambulatory Visit (HOSPITAL_COMMUNITY): Payer: Medicare Other | Attending: Neurosurgery | Admitting: Physical Therapy

## 2020-05-31 ENCOUNTER — Encounter (HOSPITAL_COMMUNITY): Payer: Self-pay | Admitting: Physical Therapy

## 2020-05-31 ENCOUNTER — Other Ambulatory Visit: Payer: Self-pay

## 2020-05-31 DIAGNOSIS — R262 Difficulty in walking, not elsewhere classified: Secondary | ICD-10-CM | POA: Diagnosis present

## 2020-05-31 DIAGNOSIS — G8929 Other chronic pain: Secondary | ICD-10-CM | POA: Diagnosis present

## 2020-05-31 DIAGNOSIS — M545 Low back pain, unspecified: Secondary | ICD-10-CM | POA: Diagnosis not present

## 2020-05-31 DIAGNOSIS — M2569 Stiffness of other specified joint, not elsewhere classified: Secondary | ICD-10-CM | POA: Diagnosis present

## 2020-05-31 NOTE — Therapy (Signed)
[facility] [address] [place], [place], [address] Phone: [phone]   Fax:  [phone]  Physical Therapy Treatment  Patient Details  Name: Rebecca Arellano MRN: [id number] Date of Birth: 1946-05-12 Referring Provider (PT): Julio Sicks   Encounter Date: 05/31/2020   PT End of Session - 05/31/20 1134    Visit Number 8    Number of Visits 12    Date for PT Re-Evaluation 06/14/20    Authorization Type UHC Medicare    Authorization Time Period no auth no VL    Progress Note Due on Visit 17    PT Start Time 1133    PT Stop Time 1212    PT Time Calculation (min) 39 min    Activity Tolerance Patient tolerated treatment well    Behavior During Therapy [facility] for tasks assessed/performed           Past Medical History:  Diagnosis Date  . Abnormal EKG   . Allergic rhinitis   . Asthma   . Chronic back pain   . DDD (degenerative disc disease), lumbar   . Degenerative lumbar disc   . Diabetes mellitus, type II (HCC)   . Goiter   . Heart murmur   . Hypertension   . Hypothyroidism   . Other and unspecified hyperlipidemia   . Reflux     Past Surgical History:  Procedure Laterality Date  . 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  . COLONOSCOPY  02/2010   Dr. Jena Gauss, left-sided diverticula, surveillance colonoscopy recommended for 5 years  . COLONOSCOPY WITH PROPOFOL N/A 08/06/2016   Procedure: COLONOSCOPY WITH PROPOFOL;  Surgeon: Corbin Ade, MD;  [facility];  Service: Endoscopy;  Laterality: N/A;  7:30 am    There were no vitals filed for this visit.   Subjective Assessment - 05/31/20 1137    Subjective States her back is a little sore this morning because she has been running around. States current soreness is 6/10 on the left and 2/10 on the right side. States her exercise are going well. She has been doing them every other day. States that she walked around a lot without the  grocery cart and it hurt a little but it wasn't that much.    Patient Stated Goals would like to be able to stand an hour or two to be able to do dishes and clean house without having to take rest breaks    Currently in Pain? Yes    Pain Score 6     Pain Location Back    Pain Orientation Left    Pain Onset More than a month ago              [facility] PT Assessment - 05/31/20 0001      Assessment   Medical Diagnosis LBP    Next MD Visit 06/07/20                         [facility] Adult PT Treatment/Exercise - 05/31/20 0001      Lumbar Exercises: Standing   Other Standing Lumbar Exercises pallof press Red theraband 2x10 B in regular stance    Other Standing Lumbar Exercises mini squats - pain in knees -  chair pose 2x5 5" holds       Knee/Hip Exercises: Standing   Lateral Step Up 2 sets;Both;10 reps;Hand Hold: 2;Step Height: 6"    Forward Step Up Both;2  sets;10 reps;Step Height: 6";Hand Hold: 1    Other Standing Knee Exercises mini squat LE slides with pillow case - holding on with hands 4x5 B                     PT Short Term Goals - 05/25/20 4008      PT SHORT TERM GOAL #1   Title Patient will be able to stand for at least 30 minutes to demonstrate improved standing endurance.    Baseline at least 10 minutes    Time 3    Period Weeks    Status On-going    Target Date 05/24/20      PT SHORT TERM GOAL #2   Title Patient will be independent in self management strategies to improve quality of life and functional outcomes.    Baseline doing exercises daily    Time 3    Period Weeks    Status Achieved    Target Date 05/24/20      PT SHORT TERM GOAL #3   Title --    Baseline --    Time --    Period --    Status --    Target Date --             PT Long Term Goals - 05/25/20 6761      PT LONG TERM GOAL #1   Title Patient will be able to stand for at least 45 minutes to demonstrate improved standing endurance.    Time 6    Period Weeks     Status On-going      PT LONG TERM GOAL #2   Title Patient will report at least 50% improvement in overall symptoms and/or function to demonstrate improved functional mobility    Time 6    Period Weeks    Status On-going      PT LONG TERM GOAL #3   Title Patient will improve on FOTO score to meet predicted outcomes to demonstrate improved functional mobility.    Baseline was 44% function ---> 61% function    Time 6    Period Weeks    Status Achieved                 Plan - 05/31/20 1206    Clinical Impression Statement Continued with standing strengthening as tolerated. mini squats were painful but chair pose was tolerated better. Fatigue in hips and legs noted end of session. No increase in pain or soreness noted end of session. Will continue with progressing standing strengthening exercises as tolerated.    Personal Factors and Comorbidities Comorbidity 1;Comorbidity 2;Comorbidity 3+;Age    Comorbidities HTN, DB, heart murmur, chronic neck pain    Examination-Activity Limitations Bed Mobility;Transfers;Stairs;Stand;Squat;Lift;Locomotion Level;Reach Overhead    Examination-Participation Restrictions Community Activity;Meal Prep;Laundry;Yard Work;Shop;Cleaning    Stability/Clinical Decision Making Stable/Uncomplicated    Rehab Potential Good    PT Frequency 2x / week    PT Duration 6 weeks    PT Treatment/Interventions ADLs/Self Care Home Management;Aquatic Therapy;Functional mobility training;Patient/family education;Manual techniques;Therapeutic exercise;Therapeutic activities;Neuromuscular re-education;Dry needling;Passive range of motion;Gait training;Stair training;Cryotherapy;Electrical Stimulation;Traction;Moist Heat;Scar mobilization    PT Next Visit Plan standing strengthening exerciess as tolerated    PT Home Exercise Plan SKC, prone hamstring curl 10/12 bridge; 10/14 SKC, LTR           Patient will benefit from skilled therapeutic intervention in order to improve  the following deficits and impairments:  Pain, Difficulty walking, Decreased mobility, Decreased range of motion,  Increased edema, Decreased endurance, Decreased skin integrity, Decreased strength  Visit Diagnosis: Chronic bilateral low back pain without sciatica  Decreased range of motion of trunk and back  Difficulty in walking, not elsewhere classified     Problem List Patient Active Problem List   Diagnosis Date Noted  . Pulmonary embolism (HCC) 08/12/2018  . DM (diabetes mellitus) (HCC) 08/12/2018  . HTN (hypertension) 08/12/2018  . Right leg DVT (HCC) 08/12/2018  . Pulmonary nodules 08/12/2018  . LUQ pain 07/09/2016  . LLQ pain 07/09/2016  . Chronic idiopathic constipation 07/09/2016  . FH: celiac disease 07/09/2016  . Murmur, cardiac 05/27/2013  . FATTY LIVER DISEASE 03/09/2010  . History of colonic polyps 03/09/2010   12:12 PM, 05/31/20 Tereasa Coop, DPT Physical Therapy with [facility]  [phone] office  [facility] [address] [place], [place], [address] Phone: [phone]   Fax:  [phone]  Name: Rebecca Arellano MRN: [id number] Date of Birth: [birth year]

## 2020-06-02 ENCOUNTER — Encounter (HOSPITAL_COMMUNITY): Payer: Self-pay

## 2020-06-02 ENCOUNTER — Ambulatory Visit (HOSPITAL_COMMUNITY): Payer: Medicare Other

## 2020-06-02 ENCOUNTER — Other Ambulatory Visit: Payer: Self-pay

## 2020-06-02 DIAGNOSIS — M545 Low back pain, unspecified: Secondary | ICD-10-CM

## 2020-06-02 DIAGNOSIS — M2569 Stiffness of other specified joint, not elsewhere classified: Secondary | ICD-10-CM

## 2020-06-02 DIAGNOSIS — G8929 Other chronic pain: Secondary | ICD-10-CM

## 2020-06-02 DIAGNOSIS — R262 Difficulty in walking, not elsewhere classified: Secondary | ICD-10-CM

## 2020-06-02 NOTE — Therapy (Signed)
[facility] [address] [place], [place], [address] Phone: [phone]   Fax:  [phone]  Physical Therapy Treatment  Patient Details  Name: Rebecca Arellano MRN: [id number] Date of Birth: 1946-04-09 Referring Provider (PT): Julio Sicks   Encounter Date: 06/02/2020   PT End of Session - 06/02/20 1116    Visit Number 9    Number of Visits 12    Date for PT Re-Evaluation 06/14/20    Authorization Type UHC Medicare    Authorization Time Period no auth no VL    Progress Note Due on Visit 17    PT Start Time 1046    PT Stop Time 1126    PT Time Calculation (min) 40 min    Activity Tolerance Patient tolerated treatment well    Behavior During Therapy [facility] for tasks assessed/performed           Past Medical History:  Diagnosis Date  . Abnormal EKG   . Allergic rhinitis   . Asthma   . Chronic back pain   . DDD (degenerative disc disease), lumbar   . Degenerative lumbar disc   . Diabetes mellitus, type II (HCC)   . Goiter   . Heart murmur   . Hypertension   . Hypothyroidism   . Other and unspecified hyperlipidemia   . Reflux     Past Surgical History:  Procedure Laterality Date  . 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  . COLONOSCOPY  02/2010   Dr. Jena Gauss, left-sided diverticula, surveillance colonoscopy recommended for 5 years  . COLONOSCOPY WITH PROPOFOL N/A 08/06/2016   Procedure: COLONOSCOPY WITH PROPOFOL;  Surgeon: Corbin Ade, MD;  [facility];  Service: Endoscopy;  Laterality: N/A;  7:30 am    There were no vitals filed for this visit.   Subjective Assessment - 06/02/20 1048    Subjective Pt reports she did a lot of bed changing wiht sheets and quilts yesterday, no reports of pain just stiffness.    Pertinent History DB, HTN, heart murmur                             [facility] Adult PT Treatment/Exercise - 06/02/20 0001      Lumbar Exercises:  Standing   Functional Squats 5 reps    Functional Squats Limitations 3D hip excursion    Row 10 reps;Theraband    Theraband Level (Row) Level 2 (Red)    Row Limitations 2 sets with core engangement    Shoulder Extension 10 reps;Theraband    Theraband Level (Shoulder Extension) Level 2 (Red)    Shoulder Extension Limitations tactile cueing    Other Standing Lumbar Exercises pallof press Red theraband 4x 5Bil partial tandem stance    Other Standing Lumbar Exercises mini squats - pain in knees -  chair pose 2x5 5" holds       Knee/Hip Exercises: Standing   Lateral Step Up Both;10 reps;Step Height: 6"    Forward Step Up Both;2 sets;10 reps;Step Height: 6";Hand Hold: 1    Forward Step Up Limitations power up with opposite LE 3" holds    Other Standing Knee Exercises mini squat LE slides with pillow case - holding on with hands 4x5 B                   PT Education - 06/02/20 1131    Education Details Educated importance of posture  and core engangement with functional activities.    Person(s) Educated Patient    Methods Explanation    Comprehension Verbalized understanding            PT Short Term Goals - 05/25/20 0927      PT SHORT TERM GOAL #1   Title Patient will be able to stand for at least 30 minutes to demonstrate improved standing endurance.    Baseline at least 10 minutes    Time 3    Period Weeks    Status On-going    Target Date 05/24/20      PT SHORT TERM GOAL #2   Title Patient will be independent in self management strategies to improve quality of life and functional outcomes.    Baseline doing exercises daily    Time 3    Period Weeks    Status Achieved    Target Date 05/24/20      PT SHORT TERM GOAL #3   Title --    Baseline --    Time --    Period --    Status --    Target Date --             PT Long Term Goals - 05/25/20 6712      PT LONG TERM GOAL #1   Title Patient will be able to stand for at least 45 minutes to demonstrate  improved standing endurance.    Time 6    Period Weeks    Status On-going      PT LONG TERM GOAL #2   Title Patient will report at least 50% improvement in overall symptoms and/or function to demonstrate improved functional mobility    Time 6    Period Weeks    Status On-going      PT LONG TERM GOAL #3   Title Patient will improve on FOTO score to meet predicted outcomes to demonstrate improved functional mobility.    Baseline was 44% function ---> 61% function    Time 6    Period Weeks    Status Achieved                 Plan - 06/02/20 1116    Clinical Impression Statement Continued with standing strengtheing for LE and core strengthening.  Pt able to tolerate with some reports of hip and LE fatigue.  Improved tolerance with chair pose vs squat.  Pt educated on importance of posture for LBP control.  Additonal postural strenghtening with theraband and progress balance/core strengthening with change to partial tandem stance during palloff exercises and power ups during forward step ups.  Discussed importance of core activaiotn and carrying plants close to body for LBP control as she plans to bring plants inside later today.    Personal Factors and Comorbidities Comorbidity 1;Comorbidity 2;Comorbidity 3+;Age    Comorbidities HTN, DB, heart murmur, chronic neck pain    Examination-Activity Limitations Bed Mobility;Transfers;Stairs;Stand;Squat;Lift;Locomotion Level;Reach Overhead    Examination-Participation Restrictions Community Activity;Meal Prep;Laundry;Yard Work;Shop;Cleaning    Stability/Clinical Decision Making Stable/Uncomplicated    Clinical Decision Making Low    Rehab Potential Good    PT Frequency 2x / week    PT Duration 6 weeks    PT Treatment/Interventions ADLs/Self Care Home Management;Aquatic Therapy;Functional mobility training;Patient/family education;Manual techniques;Therapeutic exercise;Therapeutic activities;Neuromuscular re-education;Dry needling;Passive  range of motion;Gait training;Stair training;Cryotherapy;Electrical Stimulation;Traction;Moist Heat;Scar mobilization    PT Next Visit Plan Progress note 10th visit/ prior MD on 11/11 next session.  Continue standing strengthening exerciess as tolerated  PT Home Exercise Plan [facility], prone hamstring curl 10/12 bridge; 10/14 SKC, LTR           Patient will benefit from skilled therapeutic intervention in order to improve the following deficits and impairments:  Pain, Difficulty walking, Decreased mobility, Decreased range of motion, Increased edema, Decreased endurance, Decreased skin integrity, Decreased strength  Visit Diagnosis: Chronic bilateral low back pain without sciatica  Decreased range of motion of trunk and back  Difficulty in walking, not elsewhere classified     Problem List Patient Active Problem List   Diagnosis Date Noted  . Pulmonary embolism (HCC) 08/12/2018  . DM (diabetes mellitus) (HCC) 08/12/2018  . HTN (hypertension) 08/12/2018  . Right leg DVT (HCC) 08/12/2018  . Pulmonary nodules 08/12/2018  . LUQ pain 07/09/2016  . LLQ pain 07/09/2016  . Chronic idiopathic constipation 07/09/2016  . FH: celiac disease 07/09/2016  . Murmur, cardiac 05/27/2013  . FATTY LIVER DISEASE 03/09/2010  . History of colonic polyps 03/09/2010   Becky Sax, LPTA/CLT; CBIS [phone]  Juel Burrow 06/02/2020, 11:41 AM  [facility] [address] [place], [place], [address] Phone: [phone]   Fax:  [phone]  Name: AMANDINE COVINO MRN: [id number] Date of Birth: [birth year]

## 2020-06-06 ENCOUNTER — Ambulatory Visit (HOSPITAL_COMMUNITY): Payer: Medicare Other | Admitting: Physical Therapy

## 2020-06-06 ENCOUNTER — Other Ambulatory Visit: Payer: Self-pay

## 2020-06-06 DIAGNOSIS — R262 Difficulty in walking, not elsewhere classified: Secondary | ICD-10-CM

## 2020-06-06 DIAGNOSIS — G8929 Other chronic pain: Secondary | ICD-10-CM

## 2020-06-06 DIAGNOSIS — M545 Low back pain, unspecified: Secondary | ICD-10-CM | POA: Diagnosis not present

## 2020-06-06 DIAGNOSIS — M2569 Stiffness of other specified joint, not elsewhere classified: Secondary | ICD-10-CM

## 2020-06-06 NOTE — Therapy (Signed)
[place], [place], [address] Phone: [phone]   Fax:  [phone]  Physical Therapy Treatment  Patient Details  Name: Rebecca Arellano MRN: [id number] Date of Birth: October 21, 1945 Referring Provider (PT): Earnie Larsson   Encounter Date: 06/06/2020   PT End of Session - 06/06/20 1058    Visit Number 10    Number of Visits 12    Date for PT Re-Evaluation 06/14/20    Authorization Type UHC Medicare; progress note completed visit #7    Authorization Time Period no auth no VL    Progress Note Due on Visit 17    PT Start Time 1045    PT Stop Time 1130    PT Time Calculation (min) 45 min    Activity Tolerance Patient tolerated treatment well    Behavior During Therapy [facility] for tasks assessed/performed           Past Medical History:  Diagnosis Date  . Abnormal EKG   . Allergic rhinitis   . Asthma   . Chronic back pain   . DDD (degenerative disc disease), lumbar   . Degenerative lumbar disc   . Diabetes mellitus, type II ([place])   . Goiter   . Heart murmur   . Hypertension   . Hypothyroidism   . Other and unspecified hyperlipidemia   . Reflux     Past Surgical History:  Procedure Laterality Date  . 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  . COLONOSCOPY  02/2010   Dr. Gala Romney, left-sided diverticula, surveillance colonoscopy recommended for 5 years  . COLONOSCOPY WITH PROPOFOL N/A 08/06/2016   Procedure: COLONOSCOPY WITH PROPOFOL;  Surgeon: Daneil Dolin, MD;  [facility];  Service: Endoscopy;  Laterality: N/A;  7:30 am    There were no vitals filed for this visit.   Subjective Assessment - 06/06/20 1244    Subjective pt states both her hips still hurt but each hip hurts differently. Currently without pain but states it is intermittent.  0-8/10.  States she is unsure if therapy is really helping and plans on discussing with  MD tomorrow.               [facility] PT Assessment - 06/06/20 0001      Observation/Other Assessments   Focus on Therapeutic Outcomes (FOTO)  55% functional status  (was 61%, 55%)                         [facility] Adult PT Treatment/Exercise - 06/06/20 0001      Knee/Hip Exercises: Standing   Lateral Step Up Both;10 reps;Step Height: 6"    Forward Step Up Both;2 sets;10 reps;Step Height: 6";Hand Hold: 1    Forward Step Up Limitations power up with opposite LE 3" holds    Other Standing Knee Exercises mini squat LE slides with pillow case - holding on with hands 4x5 B       Knee/Hip Exercises: Seated   Sit to Sand 10 reps;without UE support;2 sets                    PT Short Term Goals - 06/06/20 1055      PT SHORT TERM GOAL #1   Title Patient will be able to stand for at least 30 minutes to demonstrate improved standing endurance.    Time 3    Period Weeks    Status Achieved  can stand for 30 minutes, has pain after 20 minutes   Target Date 05/24/20      PT SHORT TERM GOAL #2   Title Patient will be independent in self management strategies to improve quality of life and functional outcomes.    Time 3    Period Weeks    Status Partially Met    Target Date 05/24/20      PT SHORT TERM GOAL #3   Title Patient will report at least 50% improvement in overall symptoms and/or function to demonstrate improved functional mobility    Time 3    Period Weeks    Status On-going    Target Date 05/24/20             PT Long Term Goals - 06/06/20 1057      PT LONG TERM GOAL #1   Title Patient will be able to stand for at least 45 minutes to demonstrate improved standing endurance.    Time 6    Period Weeks    Status On-going      PT LONG TERM GOAL #2   Title Patient will report at least 50% improvement in overall symptoms and/or function to demonstrate improved functional mobility    Time 6    Period Weeks    Status On-going      PT LONG TERM GOAL #3   Title Patient will improve on  FOTO score to meet predicted outcomes to demonstrate improved functional mobility.    Baseline was 44% function ---> 61% function    Time 6    Period Weeks    Status On-going                 Plan - 06/06/20 1243    Clinical Impression Statement Progress note completed visit #7; completed FOTO and reviewed goals as patient returns to MD tomorrow; pt wishes to discuss continuing PT with MD tomorrow.  FOTO is unchanged from 3 visits ago at 55% functional status.  Pt has met 1 STG thus far and is progressing towards remaining goals.  Pt required cues for form with therex.  No complaints of pain during or at end at session.    Personal Factors and Comorbidities Comorbidity 1;Comorbidity 2;Comorbidity 3+;Age    Comorbidities HTN, DB, heart murmur, chronic neck pain    Examination-Activity Limitations Bed Mobility;Transfers;Stairs;Stand;Squat;Lift;Locomotion Level;Reach Overhead    Examination-Participation Restrictions Community Activity;Meal Prep;Laundry;Yard Work;Shop;Cleaning    Stability/Clinical Decision Making Stable/Uncomplicated    Rehab Potential Good    PT Frequency 2x / week    PT Duration 6 weeks    PT Treatment/Interventions ADLs/Self Care Home Management;Aquatic Therapy;Functional mobility training;Patient/family education;Manual techniques;Therapeutic exercise;Therapeutic activities;Neuromuscular re-education;Dry needling;Passive range of motion;Gait training;Stair training;Cryotherapy;Electrical Stimulation;Traction;Moist Heat;Scar mobilization    PT Next Visit Plan Continue standing strengthening exerciess as tolerated    PT Home Exercise Plan [facility], prone hamstring curl 10/12 bridge; 10/14 [facility], LTR           Patient will benefit from skilled therapeutic intervention in order to improve the following deficits and impairments:  Pain, Difficulty walking, Decreased mobility, Decreased range of motion, Increased edema, Decreased endurance, Decreased skin integrity, Decreased  strength  Visit Diagnosis: Chronic bilateral low back pain without sciatica  Decreased range of motion of trunk and back  Difficulty in walking, not elsewhere classified     Problem List Patient Active Problem List   Diagnosis Date Noted  . Pulmonary embolism ([place]) 08/12/2018  . DM (diabetes mellitus) ([place]) 08/12/2018  .  HTN (hypertension) 08/12/2018  . Right leg DVT ([place]) 08/12/2018  . Pulmonary nodules 08/12/2018  . LUQ pain 07/09/2016  . LLQ pain 07/09/2016  . Chronic idiopathic constipation 07/09/2016  . FH: celiac disease 07/09/2016  . Murmur, cardiac 05/27/2013  . FATTY LIVER DISEASE 03/09/2010  . History of colonic polyps 03/09/2010   Teena Irani, PTA/CLT [phone]  Teena Irani 06/06/2020, 12:48 PM  [place] [address] [place], [place], [address] Phone: [phone]   Fax:  [phone]  Name: LAURNA SHETLEY MRN: [id number] Date of Birth: March 24, 1946

## 2020-06-09 ENCOUNTER — Ambulatory Visit (HOSPITAL_COMMUNITY): Payer: Medicare Other

## 2020-06-09 ENCOUNTER — Other Ambulatory Visit: Payer: Self-pay

## 2020-06-09 ENCOUNTER — Encounter (HOSPITAL_COMMUNITY): Payer: Self-pay

## 2020-06-09 DIAGNOSIS — G8929 Other chronic pain: Secondary | ICD-10-CM

## 2020-06-09 DIAGNOSIS — M2569 Stiffness of other specified joint, not elsewhere classified: Secondary | ICD-10-CM

## 2020-06-09 DIAGNOSIS — R262 Difficulty in walking, not elsewhere classified: Secondary | ICD-10-CM

## 2020-06-09 DIAGNOSIS — M545 Low back pain, unspecified: Secondary | ICD-10-CM | POA: Diagnosis not present

## 2020-06-09 NOTE — Therapy (Signed)
[place], [place], [address] Phone: [phone]   Fax:  [phone]  Physical Therapy Treatment  Patient Details  Name: Rebecca Arellano MRN: [id number] Date of Birth: [birth year] Referring Provider (PT): Earnie Larsson   Encounter Date: 06/09/2020   PT End of Session - 06/09/20 1409    Visit Number 11    Number of Visits 12    Date for PT Re-Evaluation 06/14/20    Authorization Type UHC Medicare; progress note completed visit #7    Authorization Time Period no auth no VL    Progress Note Due on Visit 17    PT Start Time 1401    PT Stop Time 1444    PT Time Calculation (min) 43 min    Activity Tolerance Patient tolerated treatment well    Behavior During Therapy [facility] for tasks assessed/performed           Past Medical History:  Diagnosis Date  . Abnormal EKG   . Allergic rhinitis   . Asthma   . Chronic back pain   . DDD (degenerative disc disease), lumbar   . Degenerative lumbar disc   . Diabetes mellitus, type II ([place])   . Goiter   . Heart murmur   . Hypertension   . Hypothyroidism   . Other and unspecified hyperlipidemia   . Reflux     Past Surgical History:  Procedure Laterality Date  . 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  . COLONOSCOPY  02/2010   Dr. Gala Romney, left-sided diverticula, surveillance colonoscopy recommended for 5 years  . COLONOSCOPY WITH PROPOFOL N/A 08/06/2016   Procedure: COLONOSCOPY WITH PROPOFOL;  Surgeon: Daneil Dolin, MD;  [facility];  Service: Endoscopy;  Laterality: N/A;  7:30 am    There were no vitals filed for this visit.   Subjective Assessment - 06/09/20 1404    Subjective Went and saw MD, stated current symptoms should be expected.  Encouraged pt to continue PT 1x/week.  Returns in 6 week.  Reports she no longer has radicular symptoms.  Does have intermittent hip soreness, no pain currently did have some earlier  today,    Pertinent History DB, HTN, heart murmur    Patient Stated Goals would like to be able to stand an hour or two to be able to do dishes and clean house without having to take rest breaks    Currently in Pain? No/denies                             [facility] Adult PT Treatment/Exercise - 06/09/20 0001      Lumbar Exercises: Standing   Heel Raises 10 reps;3 seconds    Heel Raises Limitations squat then heel raise    Row 10 reps;Theraband    Theraband Level (Row) Level 3 (Green)    Row Limitations cueing for core engangmenet    Shoulder Extension 10 reps;Theraband    Theraband Level (Shoulder Extension) Level 3 (Green)    Shoulder Extension Limitations cueing for core engangmenet    Other Standing Lumbar Exercises pallof press Red theraband 4x 5Bil partial tandem stance      Knee/Hip Exercises: Standing   Functional Squat 10 reps    Functional Squat Limitations squat then heel raise 3" holds    Stairs 1RT 4in step; 3RT 7in reciprocal pattern     Other Standing Knee Exercises mini squat  LE slides with pillow case - holding on with hands 4x5 B                     PT Short Term Goals - 06/06/20 1055      PT SHORT TERM GOAL #1   Title Patient will be able to stand for at least 30 minutes to demonstrate improved standing endurance.    Time 3    Period Weeks    Status Achieved   can stand for 30 minutes, has pain after 20 minutes   Target Date 05/24/20      PT SHORT TERM GOAL #2   Title Patient will be independent in self management strategies to improve quality of life and functional outcomes.    Time 3    Period Weeks    Status Partially Met    Target Date 05/24/20      PT SHORT TERM GOAL #3   Title Patient will report at least 50% improvement in overall symptoms and/or function to demonstrate improved functional mobility    Time 3    Period Weeks    Status On-going    Target Date 05/24/20             PT Long Term Goals - 06/06/20 1057        PT LONG TERM GOAL #1   Title Patient will be able to stand for at least 45 minutes to demonstrate improved standing endurance.    Time 6    Period Weeks    Status On-going      PT LONG TERM GOAL #2   Title Patient will report at least 50% improvement in overall symptoms and/or function to demonstrate improved functional mobility    Time 6    Period Weeks    Status On-going      PT LONG TERM GOAL #3   Title Patient will improve on FOTO score to meet predicted outcomes to demonstrate improved functional mobility.    Baseline was 44% function ---> 61% function    Time 6    Period Weeks    Status On-going                 Plan - 06/09/20 1447    Clinical Impression Statement Progressed functional strengtheing with good tolerance.  Began reciprocal pattern wiht stairs, able to demonstrate safe mechanics with 1 HR A descending stairs.  Progressed hip musculature, core and postural strengthening as well as static balance this session.  Pt able to tolerate 25 minutes of standing prior reports of fatigue required seated rest break.  Reviewed current HEP with additional exercises added for functional strengthening and balance.  Pt able to demonstrate and verbalize understanding, encouraged to complete balance activities near counter/sink for safety.  No reoprts of pain, was limited by fatigue.    Personal Factors and Comorbidities Comorbidity 1;Comorbidity 2;Comorbidity 3+;Age    Comorbidities HTN, DB, heart murmur, chronic neck pain    Examination-Activity Limitations Bed Mobility;Transfers;Stairs;Stand;Squat;Lift;Locomotion Level;Reach Overhead    Examination-Participation Restrictions Community Activity;Meal Prep;Laundry;Yard Work;Shop;Cleaning    Stability/Clinical Decision Making Stable/Uncomplicated    Rehab Potential Good    PT Frequency 2x / week    PT Duration 6 weeks    PT Treatment/Interventions ADLs/Self Care Home Management;Aquatic Therapy;Functional mobility  training;Patient/family education;Manual techniques;Therapeutic exercise;Therapeutic activities;Neuromuscular re-education;Dry needling;Passive range of motion;Gait training;Stair training;Cryotherapy;Electrical Stimulation;Traction;Moist Heat;Scar mobilization    PT Next Visit Plan Review goals next session for 12/12 visits.  (Pt reports MD wishes for  her continue PT at 1x/week if needed)    PT Home Exercise Plan [facility], prone hamstring curl 10/12 bridge; 10/14 SKC, LTR; 11/11: squat, heel raise and tandem stance           Patient will benefit from skilled therapeutic intervention in order to improve the following deficits and impairments:  Pain, Difficulty walking, Decreased mobility, Decreased range of motion, Increased edema, Decreased endurance, Decreased skin integrity, Decreased strength  Visit Diagnosis: Chronic bilateral low back pain without sciatica  Decreased range of motion of trunk and back  Difficulty in walking, not elsewhere classified     Problem List Patient Active Problem List   Diagnosis Date Noted  . Pulmonary embolism ([place]) 08/12/2018  . DM (diabetes mellitus) ([place]) 08/12/2018  . HTN (hypertension) 08/12/2018  . Right leg DVT ([place]) 08/12/2018  . Pulmonary nodules 08/12/2018  . LUQ pain 07/09/2016  . LLQ pain 07/09/2016  . Chronic idiopathic constipation 07/09/2016  . FH: celiac disease 07/09/2016  . Murmur, cardiac 05/27/2013  . FATTY LIVER DISEASE 03/09/2010  . History of colonic polyps 03/09/2010   Ihor Austin, LPTA/CLT; CBIS [phone]  Rebecca Arellano 06/09/2020, 3:06 PM  [place] [address] [place], [place], [address] Phone: [phone]   Fax:  [phone]  Name: Rebecca Arellano MRN: [id number] Date of Birth: 09-Feb-1946

## 2020-06-09 NOTE — Patient Instructions (Signed)
FUNCTIONAL MOBILITY: Squat    Stance: shoulder-width on floor. Bend hips and knees. Keep back straight. Do not allow knees to bend past toes. Squeeze glutes and quads to stand. 10 reps per set, 4 days per week  Copyright  VHI. All rights reserved.   Toe / Heel Raise (Standing)    Standing with support, raise heels, then rock back on heels and raise toes. Repeat 10 times.  Copyright  VHI. All rights reserved.   Tandem Stance    Right foot in front of left, heel touching toe both feet "straight ahead". Stand on Foot Triangle of Support with both feet. Balance in this position 30 seconds. Do with left foot in front of right.  Copyright  VHI. All rights reserved.

## 2020-06-13 ENCOUNTER — Other Ambulatory Visit: Payer: Self-pay

## 2020-06-13 ENCOUNTER — Ambulatory Visit (HOSPITAL_COMMUNITY): Payer: Medicare Other

## 2020-06-13 ENCOUNTER — Encounter (HOSPITAL_COMMUNITY): Payer: Self-pay

## 2020-06-13 DIAGNOSIS — G8929 Other chronic pain: Secondary | ICD-10-CM

## 2020-06-13 DIAGNOSIS — M545 Low back pain, unspecified: Secondary | ICD-10-CM | POA: Diagnosis not present

## 2020-06-13 DIAGNOSIS — M2569 Stiffness of other specified joint, not elsewhere classified: Secondary | ICD-10-CM

## 2020-06-13 DIAGNOSIS — R262 Difficulty in walking, not elsewhere classified: Secondary | ICD-10-CM

## 2020-06-13 NOTE — Patient Instructions (Addendum)
Sit-to-Stand Exercise  The sit-to-stand exercise (also known as the chair stand or chair rise exercise) strengthens your lower body and helps you maintain or improve your mobility and independence. The goal is to do the sit-to-stand exercise without using your hands. This will be easier as you become stronger. You should always talk with your health care provider before starting any exercise program, especially if you have had recent surgery. Do the exercise exactly as told by your health care provider and adjust it as directed. It is normal to feel mild stretching, pulling, tightness, or discomfort as you do this exercise, but you should stop right away if you feel sudden pain or your pain gets worse. Do not begin doing this exercise until told by your health care provider. What the sit-to-stand exercise does The sit-to-stand exercise helps to strengthen the muscles in your thighs and the muscles in the center of your body that give you stability (core muscles). This exercise is especially helpful if:  You have had knee or hip surgery.  You have trouble getting up from a chair, out of a car, or off the toilet. How to do the sit-to-stand exercise 1. Sit toward the front edge of a sturdy chair without armrests. Your knees should be bent and your feet should be flat on the floor and shoulder-width apart. 2. Place your hands lightly on each side of the seat. Keep your back and neck as straight as possible, with your chest slightly forward. 3. Breathe in slowly. Lean forward and slightly shift your weight to the front of your feet. 4. Breathe out as you slowly stand up. Use your hands as little as possible. 5. Stand and pause for a full breath in and out. 6. Breathe in as you sit down slowly. Tighten your core and abdominal muscles to control your lowering as much as possible. 7. Breathe out slowly. 8. Do this exercise 10-15 times. If needed, do it fewer times until you build up strength. 9. Rest for  1 minute, then do another set of 10-15 repetitions. To change the difficulty of the sit-to-stand exercise  If the exercise is too difficult, use a chair with sturdy armrests, and push off the armrests to help you come to the standing position. You can also use the armrests to help slowly lower yourself back to sitting. As this gets easier, try to use your arms less. You can also place a firm cushion or pillow on the chair to make the surface higher.  If this exercise is too easy, do not use your arms to help raise or lower yourself. You can also wear a weighted vest, use hand weights, increase your repetitions, or try a lower chair. General tips  You may feel tired when starting an exercise routine. This is normal.  You may have muscle soreness that lasts a few days. This is normal. As you get stronger, you may not feel muscle soreness.  Use smooth, steady movements.  Do not  hold your breath during strength exercises. This can cause unsafe changes in your blood pressure.  Breathe in slowly through your nose, and breathe out slowly through your mouth. Summary  Strengthening your lower body is an important step to help you move safely and independently.  The sit-to-stand exercise helps strengthen the muscles in your thighs and core.  You should always talk with your health care provider before starting any exercise program, especially if you have had recent surgery. This information is not intended to replace  advice given to you by your health care provider. Make sure you discuss any questions you have with your health care provider. Document Revised: 05/14/2018 Document Reviewed: 09/06/2016 Elsevier Patient Education  2020 Elsevier Inc.  Balance: Eyes Open - Unilateral (Varied Surfaces)    Stand on left foot, eyes open. Maintain balance _30__ seconds. Repeat _2___ times per set. Do __1__ sets per session. When stable on ground and ready for a challenge, do on compliant surface:  foam.  Copyright  VHI. All rights reserved.    SLS with vectors picture from [employer] via Mardelle Matte.

## 2020-06-13 NOTE — Therapy (Signed)
[facility] [address] [place], [place], [address] Phone: [phone]   Fax:  [phone]  Physical Therapy Treatment  Patient Details  Name: Rebecca Arellano MRN: [id number] Date of Birth: [birth year] Referring Provider (PT): Julio Sicks   Encounter Date: 06/13/2020   PT End of Session - 06/13/20 1022    Visit Number 12    Number of Visits 18    Date for PT Re-Evaluation 07/25/20    Authorization Type UHC Medicare; progress note completed visit #7    Authorization Time Period no auth no VL    Progress Note Due on Visit 17    PT Start Time 1022    PT Stop Time 1104    PT Time Calculation (min) 42 min    Activity Tolerance Patient tolerated treatment well    Behavior During Therapy [facility] for tasks assessed/performed           Past Medical History:  Diagnosis Date  . Abnormal EKG   . Allergic rhinitis   . Asthma   . Chronic back pain   . DDD (degenerative disc disease), lumbar   . Degenerative lumbar disc   . Diabetes mellitus, type II (HCC)   . Goiter   . Heart murmur   . Hypertension   . Hypothyroidism   . Other and unspecified hyperlipidemia   . Reflux     Past Surgical History:  Procedure Laterality Date  . 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  . COLONOSCOPY  02/2010   Dr. Jena Gauss, left-sided diverticula, surveillance colonoscopy recommended for 5 years  . COLONOSCOPY WITH PROPOFOL N/A 08/06/2016   Procedure: COLONOSCOPY WITH PROPOFOL;  Surgeon: Corbin Ade, MD;  [facility];  Service: Endoscopy;  Laterality: N/A;  7:30 am    There were no vitals filed for this visit.   Subjective Assessment - 06/13/20 1022    Subjective Right hip more stiff than Left today. Did most of her exerciess over the weekend. HEP going well. Forgot to do heel raise with squat.    Pertinent History DB, HTN, heart murmur    Patient Stated Goals would like to be able to stand an hour or  two to be able to do dishes and clean house without having to take rest breaks            [facility] Adult PT Treatment/Exercise - 06/13/20 0001      Lumbar Exercises: Standing   Heel Raises 10 reps;3 seconds    Heel Raises Limitations squat then heel raise    Row 10 reps;Theraband    Theraband Level (Row) Level 3 (Green)    Row Limitations 2 sets with core engagement    Shoulder Extension 10 reps;Theraband    Theraband Level (Shoulder Extension) Level 3 (Green)    Shoulder Extension Limitations 2 sets with core engagement    Other Standing Lumbar Exercises pallof press Green theraband 4x 5Bil partial tandem stance      Knee/Hip Exercises: Standing   Functional Squat 10 reps    Functional Squat Limitations squat then heel raise 3" holds    Stairs 4RT reciprocal 7in stair no UE support    SLS with Vectors bialteral 3 sec hold 5RT      Knee/Hip Exercises: Seated   Sit to Sand 2 sets;10 reps;without UE support              PT Education - 06/13/20 1247    Education  Details Discussed purpose and technique of interventions throughout session. Advanced HEP.    Person(s) Educated Patient    Methods Explanation;Handout    Comprehension Verbalized understanding            PT Short Term Goals - 06/13/20 1023      PT SHORT TERM GOAL #1   Title Patient will be able to stand for at least 30 minutes to demonstrate improved standing endurance.    Time 3    Period Weeks    Status Achieved   can stand for 30 minutes, has pain after 20 minutes   Target Date 05/24/20      PT SHORT TERM GOAL #2   Title Patient will be independent in self management strategies to improve quality of life and functional outcomes.    Time 3    Period Weeks    Status Achieved    Target Date 05/24/20      PT SHORT TERM GOAL #3   Title Patient will report at least 50% improvement in overall symptoms and/or function to demonstrate improved functional mobility    Baseline 06/13/20 - 30% improved reported      Time 3    Period Weeks    Status On-going    Target Date 07/04/20             PT Long Term Goals - 06/13/20 1023      PT LONG TERM GOAL #1   Title Patient will be able to stand for at least 45 minutes to demonstrate improved standing endurance.    Baseline Patient able to stand for 35 minutes during session today before requesting a sitting break due to right hip pain increase.    Time 6    Period Weeks    Status On-going    Target Date 07/25/20      PT LONG TERM GOAL #2   Title Patient will report at least 50% improvement in overall symptoms and/or function to demonstrate improved functional mobility    Baseline 06/13/20 - reported 30% improvement    Time 6    Period Weeks    Status On-going      PT LONG TERM GOAL #3   Title Patient will improve on FOTO score to meet predicted outcomes to demonstrate improved functional mobility.    Baseline was 44% function ---> 61% function    Time 6    Period Weeks    Status On-going            Plan - 06/13/20 1023    Clinical Impression Statement Patient feels she is getting better. Her MD stated some of the symptoms she is feeling are expected after her surgery. MD would like to see her continue with PT sessions. Patient follows up with MD in 6 weeks. Patient unable to financially afford continuing 2x/wk PT sessions. Patient is showing improvement in her functional abilities and perceived functional abilities. Patient reports performing her HEP on a regular basis. Patient continues to exhibit decreased functional performance, decreased perceived functional performance, and increased right hip pain in the standing position. Advanced HEP to included sit to stands, single leg stance and single leg stance with vectors. Advance therapeutic exercises to 4 round through of 7 inches stairs, 2 sets of resisted rows, shoulder extensions and Pallof press in partial tandem with green theraband and 2 sets of sit to stands. Added single leg stance  with vectors to therapeutic exercises. Patient will continue to benefit from skilled therapy 1x/week for  6 weeks to improve strength, balance and functional abilities and reduce right hip pain and reduce impairment and improve function.    Personal Factors and Comorbidities Comorbidity 1;Comorbidity 2;Comorbidity 3+;Age    Comorbidities HTN, DB, heart murmur, chronic neck pain    Examination-Activity Limitations Bed Mobility;Transfers;Stairs;Stand;Squat;Lift;Locomotion Level;Reach Overhead    Examination-Participation Restrictions Community Activity;Meal Prep;Laundry;Yard Work;Shop;Cleaning    Stability/Clinical Decision Making Stable/Uncomplicated    Rehab Potential Good    PT Frequency 2x / week    PT Duration 6 weeks    PT Treatment/Interventions ADLs/Self Care Home Management;Aquatic Therapy;Functional mobility training;Patient/family education;Manual techniques;Therapeutic exercise;Therapeutic activities;Neuromuscular re-education;Dry needling;Passive range of motion;Gait training;Stair training;Cryotherapy;Electrical Stimulation;Traction;Moist Heat;Scar mobilization    PT Next Visit Plan continue with functional strengthening and balance exercises increasing activity tolerance; advance HEP as able as 1x/wk PT sessions.    PT Home Exercise Plan SKC, prone hamstring curl 10/12 bridge; 10/14 SKC, LTR; 11/11: squat, heel raise and tandem stance; 06/13/20 - STS, SLS, SLS with vectors           Patient will benefit from skilled therapeutic intervention in order to improve the following deficits and impairments:  Pain, Difficulty walking, Decreased mobility, Decreased range of motion, Increased edema, Decreased endurance, Decreased skin integrity, Decreased strength  Visit Diagnosis: Chronic bilateral low back pain without sciatica  Decreased range of motion of trunk and back  Difficulty in walking, not elsewhere classified     Problem List Patient Active Problem List   Diagnosis Date  Noted  . Pulmonary embolism (HCC) 08/12/2018  . DM (diabetes mellitus) (HCC) 08/12/2018  . HTN (hypertension) 08/12/2018  . Right leg DVT (HCC) 08/12/2018  . Pulmonary nodules 08/12/2018  . LUQ pain 07/09/2016  . LLQ pain 07/09/2016  . Chronic idiopathic constipation 07/09/2016  . FH: celiac disease 07/09/2016  . Murmur, cardiac 05/27/2013  . FATTY LIVER DISEASE 03/09/2010  . History of colonic polyps 03/09/2010    Katina Dung. Hartnett-Rands, MS, PT [facility] #[id number] 06/13/2020, 1:06 PM  [facility] [address] [place], [place], [address] Phone: [phone]   Fax:  [phone]  Name: Rebecca Arellano MRN: [id number] Date of Birth: 10-05-1945

## 2020-06-22 ENCOUNTER — Other Ambulatory Visit: Payer: Self-pay

## 2020-06-22 ENCOUNTER — Encounter (HOSPITAL_COMMUNITY): Payer: Self-pay | Admitting: Physical Therapy

## 2020-06-22 ENCOUNTER — Ambulatory Visit (HOSPITAL_COMMUNITY): Payer: Medicare Other | Admitting: Physical Therapy

## 2020-06-22 DIAGNOSIS — M2569 Stiffness of other specified joint, not elsewhere classified: Secondary | ICD-10-CM

## 2020-06-22 DIAGNOSIS — R262 Difficulty in walking, not elsewhere classified: Secondary | ICD-10-CM

## 2020-06-22 DIAGNOSIS — M545 Low back pain, unspecified: Secondary | ICD-10-CM

## 2020-06-22 NOTE — Therapy (Signed)
[facility] [address] [place], [place], [address] Phone: [phone]   Fax:  [phone]  Physical Therapy Treatment  Patient Details  Name: Rebecca Arellano MRN: [id number] Date of Birth: 1945/10/11 Referring Provider (PT): Julio Sicks   Encounter Date: 06/22/2020   PT End of Session - 06/22/20 0952    Visit Number 13    Number of Visits 18    Date for PT Re-Evaluation 07/25/20    Authorization Type UHC Medicare; progress note completed visit #7    Authorization Time Period no auth no VL    Progress Note Due on Visit 17    PT Start Time 0948    PT Stop Time 1028    PT Time Calculation (min) 40 min    Activity Tolerance Patient tolerated treatment well    Behavior During Therapy [facility] for tasks assessed/performed           Past Medical History:  Diagnosis Date  . Abnormal EKG   . Allergic rhinitis   . Asthma   . Chronic back pain   . DDD (degenerative disc disease), lumbar   . Degenerative lumbar disc   . Diabetes mellitus, type II (HCC)   . Goiter   . Heart murmur   . Hypertension   . Hypothyroidism   . Other and unspecified hyperlipidemia   . Reflux     Past Surgical History:  Procedure Laterality Date  . 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  . COLONOSCOPY  02/2010   Dr. Jena Gauss, left-sided diverticula, surveillance colonoscopy recommended for 5 years  . COLONOSCOPY WITH PROPOFOL N/A 08/06/2016   Procedure: COLONOSCOPY WITH PROPOFOL;  Surgeon: Corbin Ade, MD;  [facility];  Service: Endoscopy;  Laterality: N/A;  7:30 am    There were no vitals filed for this visit.   Subjective Assessment - 06/22/20 0952    Subjective Patient says her back is ok. Says she did a lot of walking around yesterday and she was sore. Did exercise later in the day and she felt better.    Pertinent History DB, HTN, heart murmur    Patient Stated Goals would like to be able to stand an  hour or two to be able to do dishes and clean house without having to take rest breaks    Currently in Pain? Yes    Pain Score 1     Pain Location Back    Pain Orientation Posterior;Right    Pain Descriptors / Indicators Aching    Pain Onset More than a month ago    Pain Frequency Constant                             [facility] Adult PT Treatment/Exercise - 06/22/20 0001      Lumbar Exercises: Standing   Heel Raises 10 reps    Functional Squats 10 reps    Functional Squats Limitations to chair, with heel raise at top     Row Theraband;20 reps    Theraband Level (Row) Level 3 (Green)    Shoulder Extension Theraband;20 reps    Theraband Level (Shoulder Extension) Level 3 (Green)    Other Standing Lumbar Exercises palloff press Green theraband 2 x 10 each bil partial tandem stance      Knee/Hip Exercises: Standing   Hip Abduction Both;2 sets;10 reps    Hip Extension Both;2 sets;10 reps  Lateral Step Up Both;1 set;15 reps;Hand Hold: 1;Step Height: 6"    Forward Step Up Both;1 set;15 reps;Hand Hold: 1;Step Height: 6"    SLS with Vectors bilateral 5 sec hold 3RT with finger touch assist                    PT Short Term Goals - 06/13/20 1023      PT SHORT TERM GOAL #1   Title Patient will be able to stand for at least 30 minutes to demonstrate improved standing endurance.    Time 3    Period Weeks    Status Achieved   can stand for 30 minutes, has pain after 20 minutes   Target Date 05/24/20      PT SHORT TERM GOAL #2   Title Patient will be independent in self management strategies to improve quality of life and functional outcomes.    Time 3    Period Weeks    Status Achieved    Target Date 05/24/20      PT SHORT TERM GOAL #3   Title Patient will report at least 50% improvement in overall symptoms and/or function to demonstrate improved functional mobility    Baseline 06/13/20 - 30% improved reported    Time 3    Period Weeks    Status On-going     Target Date 07/04/20             PT Long Term Goals - 06/13/20 1023      PT LONG TERM GOAL #1   Title Patient will be able to stand for at least 45 minutes to demonstrate improved standing endurance.    Baseline Patient able to stand for 35 minutes during session today before requesting a sitting break due to right hip pain increase.    Time 6    Period Weeks    Status On-going    Target Date 07/25/20      PT LONG TERM GOAL #2   Title Patient will report at least 50% improvement in overall symptoms and/or function to demonstrate improved functional mobility    Baseline 06/13/20 - reported 30% improvement    Time 6    Period Weeks    Status On-going      PT LONG TERM GOAL #3   Title Patient will improve on FOTO score to meet predicted outcomes to demonstrate improved functional mobility.    Baseline was 44% function ---> 61% function    Time 6    Period Weeks    Status On-going                 Plan - 06/22/20 1029    Clinical Impression Statement Patient progressing well to therapy goals. Patient requires verbal cues for proper form and mechanics with squatting with heel raise, and core activation with band resistance exercise. Patient challenged with SLS vectors but able to complete with cues for finger touch assist. Patient with noted muscle fatigue in legs and back requiring several breaks for rest. Patient reports no increased pain during today's session. Patient will continue to benefit from skilled therapy services to progress core strength and mobility to reduce pain and improve LOF with ADLs.    Personal Factors and Comorbidities Comorbidity 1;Comorbidity 2;Comorbidity 3+;Age    Comorbidities HTN, DB, heart murmur, chronic neck pain    Examination-Activity Limitations Bed Mobility;Transfers;Stairs;Stand;Squat;Lift;Locomotion Level;Reach Overhead    Examination-Participation Restrictions Community Activity;Meal Prep;Laundry;Yard Work;Shop;Cleaning     Stability/Clinical Decision Making Stable/Uncomplicated  Rehab Potential Good    PT Frequency 2x / week    PT Duration 6 weeks    PT Treatment/Interventions ADLs/Self Care Home Management;Aquatic Therapy;Functional mobility training;Patient/family education;Manual techniques;Therapeutic exercise;Therapeutic activities;Neuromuscular re-education;Dry needling;Passive range of motion;Gait training;Stair training;Cryotherapy;Electrical Stimulation;Traction;Moist Heat;Scar mobilization    PT Next Visit Plan continue with functional strengthening and balance exercises increasing activity tolerance; advance HEP as able as 1x/wk PT sessions.    PT Home Exercise Plan SKC, prone hamstring curl 10/12 bridge; 10/14 SKC, LTR; 11/11: squat, heel raise and tandem stance; 06/13/20 - STS, SLS, SLS with vectors    Consulted and Agree with Plan of Care Patient           Patient will benefit from skilled therapeutic intervention in order to improve the following deficits and impairments:  Pain, Difficulty walking, Decreased mobility, Decreased range of motion, Increased edema, Decreased endurance, Decreased skin integrity, Decreased strength  Visit Diagnosis: Chronic bilateral low back pain without sciatica  Decreased range of motion of trunk and back  Difficulty in walking, not elsewhere classified     Problem List Patient Active Problem List   Diagnosis Date Noted  . Pulmonary embolism (HCC) 08/12/2018  . DM (diabetes mellitus) (HCC) 08/12/2018  . HTN (hypertension) 08/12/2018  . Right leg DVT (HCC) 08/12/2018  . Pulmonary nodules 08/12/2018  . LUQ pain 07/09/2016  . LLQ pain 07/09/2016  . Chronic idiopathic constipation 07/09/2016  . FH: celiac disease 07/09/2016  . Murmur, cardiac 05/27/2013  . FATTY LIVER DISEASE 03/09/2010  . History of colonic polyps 03/09/2010    10:30 AM, 06/22/20 Georges Lynch PT DPT  Physical Therapist with [facility]  [facility]  [phone]   [facility] [address] [place], [place], [address] Phone: [phone]   Fax:  [phone]  Name: Rebecca Arellano MRN: [id number] Date of Birth: 1945-10-26

## 2020-06-28 ENCOUNTER — Ambulatory Visit (HOSPITAL_COMMUNITY): Payer: Medicare Other | Admitting: Physical Therapy

## 2020-06-28 ENCOUNTER — Encounter (HOSPITAL_COMMUNITY): Payer: Self-pay | Admitting: Physical Therapy

## 2020-06-28 ENCOUNTER — Other Ambulatory Visit: Payer: Self-pay

## 2020-06-28 DIAGNOSIS — M2569 Stiffness of other specified joint, not elsewhere classified: Secondary | ICD-10-CM

## 2020-06-28 DIAGNOSIS — M545 Low back pain, unspecified: Secondary | ICD-10-CM | POA: Diagnosis not present

## 2020-06-28 DIAGNOSIS — G8929 Other chronic pain: Secondary | ICD-10-CM

## 2020-06-28 DIAGNOSIS — R262 Difficulty in walking, not elsewhere classified: Secondary | ICD-10-CM

## 2020-06-28 NOTE — Therapy (Signed)
[facility] [address] [place], [place], [address] Phone: [phone]   Fax:  [phone]  Physical Therapy Treatment  Patient Details  Name: Rebecca Arellano MRN: [id number] Date of Birth: 04/13/1946 Referring Provider (PT): Julio Sicks   Encounter Date: 06/28/2020   PT End of Session - 06/28/20 0916    Visit Number 14    Number of Visits 18    Date for PT Re-Evaluation 07/25/20    Authorization Type U[employer] Medicare; progress note completed visit #7    Authorization Time Period no auth no VL    Progress Note Due on Visit 17    PT Start Time 0917    PT Stop Time 0955    PT Time Calculation (min) 38 min    Activity Tolerance Patient tolerated treatment well    Behavior During Therapy [facility] for tasks assessed/performed           Past Medical History:  Diagnosis Date  . Abnormal EKG   . Allergic rhinitis   . Asthma   . Chronic back pain   . DDD (degenerative disc disease), lumbar   . Degenerative lumbar disc   . Diabetes mellitus, type II (HCC)   . Goiter   . Heart murmur   . Hypertension   . Hypothyroidism   . Other and unspecified hyperlipidemia   . Reflux     Past Surgical History:  Procedure Laterality Date  . 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  . COLONOSCOPY  02/2010   Dr. Jena Gauss, left-sided diverticula, surveillance colonoscopy recommended for 5 years  . COLONOSCOPY WITH PROPOFOL N/A 08/06/2016   Procedure: COLONOSCOPY WITH PROPOFOL;  Surgeon: Corbin Ade, MD;  [facility];  Service: Endoscopy;  Laterality: N/A;  7:30 am    There were no vitals filed for this visit.   Subjective Assessment - 06/28/20 0916    Subjective States she tripped and fell over the dog on Saturday. States she been sore in her knees. States her side of her leg on the right side is also bothering her. States she was doing her exercises and they are going well. Current pain reported as 5/10  reported as soreness with hip worse then knees.    Pertinent History DB, HTN, heart murmur    Patient Stated Goals would like to be able to stand an hour or two to be able to do dishes and clean house without having to take rest breaks    Currently in Pain? Yes    Pain Score 5     Pain Location Hip    Pain Orientation Right;Lateral    Pain Descriptors / Indicators Sore    Pain Radiating Towards in knees as well    Pain Onset More than a month ago              [facility] PT Assessment - 06/28/20 0001      Assessment   Medical Diagnosis LBP    Referring Provider (PT) Julio Sicks    Next MD Visit 07/28/20                         [facility] Adult PT Treatment/Exercise - 06/28/20 0001      Lumbar Exercises: Standing   Other Standing Lumbar Exercises R hip glide 3x8, 5" holds     Other Standing Lumbar Exercises lateral stepping 3x5 B 15 feet; backwards and forwards walking with anterior  pull  1 pllate - not tolerated well      Knee/Hip Exercises: Standing   Lateral Step Up --    Forward Step Up Both;Step Height: 6";Hand Hold: 1;5 reps;3 sets    Forward Step Up Limitations --    Other Standing Knee Exercises --      Knee/Hip Exercises: Seated   Sit to Sand --                  PT Education - 06/28/20 0948    Education Details on fall and likely bruising muscels with no current concerns of injury to low back.    Person(s) Educated Patient    Methods Explanation    Comprehension Verbalized understanding            PT Short Term Goals - 06/13/20 1023      PT SHORT TERM GOAL #1   Title Patient will be able to stand for at least 30 minutes to demonstrate improved standing endurance.    Time 3    Period Weeks    Status Achieved   can stand for 30 minutes, has pain after 20 minutes   Target Date 05/24/20      PT SHORT TERM GOAL #2   Title Patient will be independent in self management strategies to improve quality of life and functional outcomes.    Time 3      Period Weeks    Status Achieved    Target Date 05/24/20      PT SHORT TERM GOAL #3   Title Patient will report at least 50% improvement in overall symptoms and/or function to demonstrate improved functional mobility    Baseline 06/13/20 - 30% improved reported    Time 3    Period Weeks    Status On-going    Target Date 07/04/20             PT Long Term Goals - 06/13/20 1023      PT LONG TERM GOAL #1   Title Patient will be able to stand for at least 45 minutes to demonstrate improved standing endurance.    Baseline Patient able to stand for 35 minutes during session today before requesting a sitting break due to right hip pain increase.    Time 6    Period Weeks    Status On-going    Target Date 07/25/20      PT LONG TERM GOAL #2   Title Patient will report at least 50% improvement in overall symptoms and/or function to demonstrate improved functional mobility    Baseline 06/13/20 - reported 30% improvement    Time 6    Period Weeks    Status On-going      PT LONG TERM GOAL #3   Title Patient will improve on FOTO score to meet predicted outcomes to demonstrate improved functional mobility.    Baseline was 44% function ---> 61% function    Time 6    Period Weeks    Status On-going                 Plan - 06/28/20 0916    Clinical Impression Statement With recent fall, patient had increased lateral leg pain that reduced with right hip glide in standing. Trialed forward and backward walking with anterior pull but this was not tolerated well. Transitioned to lateral stepping and this was tolerated well with reduced symptoms reported in left hip after this exercise. Verbal cues throughout session for form. Slight decrease  in exercise tolerance secondary soreness from recent fall. Allowed for longer rest breaks as needed. Will continue with current POC as tolerated.    Personal Factors and Comorbidities Comorbidity 1;Comorbidity 2;Comorbidity 3+;Age    Comorbidities  HTN, DB, heart murmur, chronic neck pain    Examination-Activity Limitations Bed Mobility;Transfers;Stairs;Stand;Squat;Lift;Locomotion Level;Reach Overhead    Examination-Participation Restrictions Community Activity;Meal Prep;Laundry;Yard Work;Shop;Cleaning    Stability/Clinical Decision Making Stable/Uncomplicated    Rehab Potential Good    PT Frequency 2x / week    PT Duration 6 weeks    PT Treatment/Interventions ADLs/Self Care Home Management;Aquatic Therapy;Functional mobility training;Patient/family education;Manual techniques;Therapeutic exercise;Therapeutic activities;Neuromuscular re-education;Dry needling;Passive range of motion;Gait training;Stair training;Cryotherapy;Electrical Stimulation;Traction;Moist Heat;Scar mobilization    PT Next Visit Plan continue with functional strengthening and balance exercises increasing activity tolerance; advance HEP as able as 1x/wk PT sessions.    PT Home Exercise Plan SKC, prone hamstring curl 10/12 bridge; 10/14 SKC, LTR; 11/11: squat, heel raise and tandem stance; 06/13/20 - STS, SLS, SLS with vectors    Consulted and Agree with Plan of Care Patient           Patient will benefit from skilled therapeutic intervention in order to improve the following deficits and impairments:  Pain, Difficulty walking, Decreased mobility, Decreased range of motion, Increased edema, Decreased endurance, Decreased skin integrity, Decreased strength  Visit Diagnosis: Chronic bilateral low back pain without sciatica  Decreased range of motion of trunk and back  Difficulty in walking, not elsewhere classified     Problem List Patient Active Problem List   Diagnosis Date Noted  . Pulmonary embolism (HCC) 08/12/2018  . DM (diabetes mellitus) (HCC) 08/12/2018  . HTN (hypertension) 08/12/2018  . Right leg DVT (HCC) 08/12/2018  . Pulmonary nodules 08/12/2018  . LUQ pain 07/09/2016  . LLQ pain 07/09/2016  . Chronic idiopathic constipation 07/09/2016  .  FH: celiac disease 07/09/2016  . Murmur, cardiac 05/27/2013  . FATTY LIVER DISEASE 03/09/2010  . History of colonic polyps 03/09/2010   9:56 AM, 06/28/20 Tereasa Coop, DPT Physical Therapy with [facility]  [phone] office  [facility] [address] [place], [place], [address] Phone: [phone]   Fax:  [phone]  Name: LUARA FAYE MRN: [id number] Date of Birth: [birth year]

## 2020-07-05 ENCOUNTER — Other Ambulatory Visit: Payer: Self-pay

## 2020-07-05 ENCOUNTER — Ambulatory Visit (HOSPITAL_COMMUNITY): Payer: Medicare Other | Attending: Neurosurgery

## 2020-07-05 ENCOUNTER — Encounter (HOSPITAL_COMMUNITY): Payer: Self-pay

## 2020-07-05 DIAGNOSIS — R262 Difficulty in walking, not elsewhere classified: Secondary | ICD-10-CM | POA: Insufficient documentation

## 2020-07-05 DIAGNOSIS — G8929 Other chronic pain: Secondary | ICD-10-CM | POA: Insufficient documentation

## 2020-07-05 DIAGNOSIS — M2569 Stiffness of other specified joint, not elsewhere classified: Secondary | ICD-10-CM | POA: Diagnosis present

## 2020-07-05 DIAGNOSIS — M545 Low back pain, unspecified: Secondary | ICD-10-CM | POA: Diagnosis present

## 2020-07-05 NOTE — Therapy (Signed)
[facility] [address] [place], [place], [address] Phone: [phone]   Fax:  [phone]  Physical Therapy Treatment  Patient Details  Name: Rebecca Arellano MRN: [id number] Date of Birth: March 30, 1946 Referring Provider (PT): Julio Sicks   Encounter Date: 07/05/2020   PT End of Session - 07/05/20 1044    Visit Number 15    Number of Visits 18    Date for PT Re-Evaluation 07/25/20    Authorization Type UHC Medicare; progress note completed visit #7    Authorization Time Period no auth no VL    Progress Note Due on Visit 17    PT Start Time 1004    PT Stop Time 1043    PT Time Calculation (min) 39 min    Activity Tolerance Patient tolerated treatment well;No increased pain    Behavior During Therapy WFL for tasks assessed/performed           Past Medical History:  Diagnosis Date  . Abnormal EKG   . Allergic rhinitis   . Asthma   . Chronic back pain   . DDD (degenerative disc disease), lumbar   . Degenerative lumbar disc   . Diabetes mellitus, type II (HCC)   . Goiter   . Heart murmur   . Hypertension   . Hypothyroidism   . Other and unspecified hyperlipidemia   . Reflux     Past Surgical History:  Procedure Laterality Date  . 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  . COLONOSCOPY  02/2010   Dr. Jena Gauss, left-sided diverticula, surveillance colonoscopy recommended for 5 years  . COLONOSCOPY WITH PROPOFOL N/A 08/06/2016   Procedure: COLONOSCOPY WITH PROPOFOL;  Surgeon: Corbin Ade, MD;  [facility];  Service: Endoscopy;  Laterality: N/A;  7:30 am    There were no vitals filed for this visit.   Subjective Assessment - 07/05/20 1007    Subjective Pt reports she feels soreness and unsure if related to latest fall or the weather.  Reports she has decreased HEP compliance due to soreness, has continued to walk daily.  Rt knee and hip soreness pain scale 6/10.    Pertinent  History DB, HTN, heart murmur    Patient Stated Goals would like to be able to stand an hour or two to be able to do dishes and clean house without having to take rest breaks    Currently in Pain? Yes    Pain Score 6     Pain Location Hip    Pain Orientation Right    Pain Descriptors / Indicators Sore    Pain Type Chronic pain    Pain Onset More than a month ago    Pain Frequency Intermittent    Aggravating Factors  standing, walking    Pain Relieving Factors pain meds, sitting                             [facility] Adult PT Treatment/Exercise - 07/05/20 0001      Lumbar Exercises: Machines for Strengthening   Other Lumbar Machine Exercise lateral steppage with 1Pl body craft 5x each direction      Lumbar Exercises: Standing   Heel Raises 10 reps    Heel Raises Limitations 2 sets    Functional Squats 10 reps    Functional Squats Limitations 2 sets front of chair wihtout HHA    Shoulder Extension Strengthening;Both;10 reps;Theraband  Theraband Level (Shoulder Extension) Level 3 (Green)    Shoulder Extension Limitations march with shoulder extension 10x 3"    Other Standing Lumbar Exercises hip glides 10x 5"    Other Standing Lumbar Exercises partial tandem stance wiht GTB palloff 4x 15      Knee/Hip Exercises: Standing   Functional Squat 2 sets;10 reps    Functional Squat Limitations squat then heel raise 3" holds    SLS with Vectors bilateral 5 sec hold 3RT with finger touch assist                    PT Short Term Goals - 06/13/20 1023      PT SHORT TERM GOAL #1   Title Patient will be able to stand for at least 30 minutes to demonstrate improved standing endurance.    Time 3    Period Weeks    Status Achieved   can stand for 30 minutes, has pain after 20 minutes   Target Date 05/24/20      PT SHORT TERM GOAL #2   Title Patient will be independent in self management strategies to improve quality of life and functional outcomes.    Time 3     Period Weeks    Status Achieved    Target Date 05/24/20      PT SHORT TERM GOAL #3   Title Patient will report at least 50% improvement in overall symptoms and/or function to demonstrate improved functional mobility    Baseline 06/13/20 - 30% improved reported    Time 3    Period Weeks    Status On-going    Target Date 07/04/20             PT Long Term Goals - 06/13/20 1023      PT LONG TERM GOAL #1   Title Patient will be able to stand for at least 45 minutes to demonstrate improved standing endurance.    Baseline Patient able to stand for 35 minutes during session today before requesting a sitting break due to right hip pain increase.    Time 6    Period Weeks    Status On-going    Target Date 07/25/20      PT LONG TERM GOAL #2   Title Patient will report at least 50% improvement in overall symptoms and/or function to demonstrate improved functional mobility    Baseline 06/13/20 - reported 30% improvement    Time 6    Period Weeks    Status On-going      PT LONG TERM GOAL #3   Title Patient will improve on FOTO score to meet predicted outcomes to demonstrate improved functional mobility.    Baseline was 44% function ---> 61% function    Time 6    Period Weeks    Status On-going                 Plan - 07/05/20 1045    Clinical Impression Statement Session focus with functional strengthening and balance training.  Pt able to complete all exercises with no reports of pain, did report some stretch during mobility exercises.  Added marching during theraband shoulder extension for core strengthening paired with balance training wiht some difficulty during SLS.  Did require occasional seated rest break through session per fatigue.    Personal Factors and Comorbidities Comorbidity 1;Comorbidity 2;Comorbidity 3+;Age    Comorbidities HTN, DB, heart murmur, chronic neck pain    Examination-Activity Limitations Bed Mobility;Transfers;Stairs;Stand;Squat;Lift;Locomotion  Level;Reach  Overhead    Examination-Participation Restrictions Community Activity;Meal Prep;Laundry;Yard Work;Shop;Cleaning    Stability/Clinical Decision Making Stable/Uncomplicated    Clinical Decision Making Low    Rehab Potential Good    PT Frequency 2x / week    PT Duration 6 weeks    PT Treatment/Interventions ADLs/Self Care Home Management;Aquatic Therapy;Functional mobility training;Patient/family education;Manual techniques;Therapeutic exercise;Therapeutic activities;Neuromuscular re-education;Dry needling;Passive range of motion;Gait training;Stair training;Cryotherapy;Electrical Stimulation;Traction;Moist Heat;Scar mobilization    PT Next Visit Plan continue with functional strengthening and balance exercises increasing activity tolerance; advance HEP as able as 1x/wk PT sessions.    PT Home Exercise Plan SKC, prone hamstring curl 10/12 bridge; 10/14 SKC, LTR; 11/11: squat, heel raise and tandem stance; 06/13/20 - STS, SLS, SLS with vectors           Patient will benefit from skilled therapeutic intervention in order to improve the following deficits and impairments:  Pain, Difficulty walking, Decreased mobility, Decreased range of motion, Increased edema, Decreased endurance, Decreased skin integrity, Decreased strength  Visit Diagnosis: Chronic bilateral low back pain without sciatica  Decreased range of motion of trunk and back  Difficulty in walking, not elsewhere classified     Problem List Patient Active Problem List   Diagnosis Date Noted  . Pulmonary embolism (HCC) 08/12/2018  . DM (diabetes mellitus) (HCC) 08/12/2018  . HTN (hypertension) 08/12/2018  . Right leg DVT (HCC) 08/12/2018  . Pulmonary nodules 08/12/2018  . LUQ pain 07/09/2016  . LLQ pain 07/09/2016  . Chronic idiopathic constipation 07/09/2016  . FH: celiac disease 07/09/2016  . Murmur, cardiac 05/27/2013  . FATTY LIVER DISEASE 03/09/2010  . History of colonic polyps 03/09/2010   Becky Sax, LPTA/CLT; CBIS [phone]  Juel Burrow 07/05/2020, 10:49 AM  [facility] [address] [place], [place], [address] Phone: [phone]   Fax:  [phone]  Name: Rebecca Arellano MRN: [id number] Date of Birth: February 15, 1946

## 2020-07-12 ENCOUNTER — Ambulatory Visit (HOSPITAL_COMMUNITY): Payer: Medicare Other

## 2020-07-12 ENCOUNTER — Encounter (HOSPITAL_COMMUNITY): Payer: Self-pay

## 2020-07-12 ENCOUNTER — Other Ambulatory Visit: Payer: Self-pay

## 2020-07-12 DIAGNOSIS — M2569 Stiffness of other specified joint, not elsewhere classified: Secondary | ICD-10-CM

## 2020-07-12 DIAGNOSIS — M545 Low back pain, unspecified: Secondary | ICD-10-CM | POA: Diagnosis not present

## 2020-07-12 DIAGNOSIS — R262 Difficulty in walking, not elsewhere classified: Secondary | ICD-10-CM

## 2020-07-12 NOTE — Therapy (Signed)
[facility] [address] [place], [place], [address] Phone: [phone]   Fax:  [phone]  Physical Therapy Treatment  Patient Details  Name: Rebecca Arellano MRN: [id number] Date of Birth: 1945-09-17 Referring Provider (PT): Julio Sicks   Encounter Date: 07/12/2020   PT End of Session - 07/12/20 1051    Visit Number 16    Number of Visits 18    Date for PT Re-Evaluation 07/25/20    Authorization Type UHC Medicare; progress note completed visit #7    Authorization Time Period no auth no VL    Progress Note Due on Visit 17    PT Start Time 1039    PT Stop Time 1120    PT Time Calculation (min) 41 min    Activity Tolerance Patient tolerated treatment well;No increased pain    Behavior During Therapy WFL for tasks assessed/performed           Past Medical History:  Diagnosis Date   Abnormal EKG    Allergic rhinitis    Asthma    Chronic back pain    DDD (degenerative disc disease), lumbar    Degenerative lumbar disc    Diabetes mellitus, type II (HCC)    Goiter    Heart murmur    Hypertension    Hypothyroidism    Other and unspecified hyperlipidemia    Reflux     Past Surgical History:  Procedure Laterality Date   COLONOSCOPY  2000   [place]: 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   COLONOSCOPY  02/2010   Dr. Jena Gauss, left-sided diverticula, surveillance colonoscopy recommended for 5 years   COLONOSCOPY WITH PROPOFOL N/A 08/06/2016   Procedure: COLONOSCOPY WITH PROPOFOL;  Surgeon: Corbin Ade, MD;  [facility];  Service: Endoscopy;  Laterality: N/A;  7:30 am    There were no vitals filed for this visit.   Subjective Assessment - 07/12/20 1049    Subjective Pt reports she is feeling good today, no reports of pain or recent fall.  Reports she has been compliance iwht HEP, feels she should be doing more (stated compliant 3 days a week.    Patient Stated Goals would like to be able to  stand an hour or two to be able to do dishes and clean house without having to take rest breaks    Currently in Pain? No/denies              [facility] PT Assessment - 07/12/20 0001      Assessment   Medical Diagnosis LBP    Referring Provider (PT) Julio Sicks    Next MD Visit 07/28/20    Prior Therapy no                          [facility] Adult PT Treatment/Exercise - 07/12/20 0001      Lumbar Exercises: Machines for Strengthening   Other Lumbar Machine Exercise lateral steppage with 1Pl body craft 5x each direction      Lumbar Exercises: Standing   Heel Raises 10 reps    Heel Raises Limitations squat then heel raise; 2 sets    Functional Squats Limitations squat then heel raise; 2 sets    Forward Lunge 10 reps    Forward Lunge Limitations on 4 in step    Shoulder Extension Strengthening;Both;10 reps;Theraband    Theraband Level (Shoulder Extension) Level 3 (Green)    Shoulder Extension Limitations march with shoulder extension  Other Standing Lumbar Exercises partial tandem stance wiht GTB palloff 4x 15                    PT Short Term Goals - 06/13/20 1023      PT SHORT TERM GOAL #1   Title Patient will be able to stand for at least 30 minutes to demonstrate improved standing endurance.    Time 3    Period Weeks    Status Achieved   can stand for 30 minutes, has pain after 20 minutes   Target Date 05/24/20      PT SHORT TERM GOAL #2   Title Patient will be independent in self management strategies to improve quality of life and functional outcomes.    Time 3    Period Weeks    Status Achieved    Target Date 05/24/20      PT SHORT TERM GOAL #3   Title Patient will report at least 50% improvement in overall symptoms and/or function to demonstrate improved functional mobility    Baseline 06/13/20 - 30% improved reported    Time 3    Period Weeks    Status On-going    Target Date 07/04/20             PT Long Term Goals - 06/13/20 1023       PT LONG TERM GOAL #1   Title Patient will be able to stand for at least 45 minutes to demonstrate improved standing endurance.    Baseline Patient able to stand for 35 minutes during session today before requesting a sitting break due to right hip pain increase.    Time 6    Period Weeks    Status On-going    Target Date 07/25/20      PT LONG TERM GOAL #2   Title Patient will report at least 50% improvement in overall symptoms and/or function to demonstrate improved functional mobility    Baseline 06/13/20 - reported 30% improvement    Time 6    Period Weeks    Status On-going      PT LONG TERM GOAL #3   Title Patient will improve on FOTO score to meet predicted outcomes to demonstrate improved functional mobility.    Baseline was 44% function ---> 61% function    Time 6    Period Weeks    Status On-going                 Plan - 07/12/20 1117    Clinical Impression Statement Pt progressing well towards goals.  Presents with increased ease during balance associated exercises, though still difficult with SLS, does presents with improved stability.  Progressed functional strengthening with additional lunges.  Continues to require cueing for form and stability with exercise.  Rest breaks were required through session due to fatigue.  No reports of increased pain at EOS.    Personal Factors and Comorbidities Comorbidity 1;Comorbidity 2;Comorbidity 3+;Age    Comorbidities HTN, DB, heart murmur, chronic neck pain    Examination-Activity Limitations Bed Mobility;Transfers;Stairs;Stand;Squat;Lift;Locomotion Level;Reach Overhead    Examination-Participation Restrictions Community Activity;Meal Prep;Laundry;Yard Work;Shop;Cleaning    Stability/Clinical Decision Making Stable/Uncomplicated    Clinical Decision Making Low    Rehab Potential Good    PT Frequency 2x / week    PT Duration 6 weeks    PT Treatment/Interventions ADLs/Self Care Home Management;Aquatic Therapy;Functional  mobility training;Patient/family education;Manual techniques;Therapeutic exercise;Therapeutic activities;Neuromuscular re-education;Dry needling;Passive range of motion;Gait training;Stair training;Cryotherapy;Management consultant  PT Next Visit Plan Progress note due next session.  continue with functional strengthening and balance exercises increasing activity tolerance; advance HEP as able as 1x/wk PT sessions.    PT Home Exercise Plan SKC, prone hamstring curl 10/12 bridge; 10/14 SKC, LTR; 11/11: squat, heel raise and tandem stance; 06/13/20 - STS, SLS, SLS with vectors           Patient will benefit from skilled therapeutic intervention in order to improve the following deficits and impairments:  Pain,Difficulty walking,Decreased mobility,Decreased range of motion,Increased edema,Decreased endurance,Decreased skin integrity,Decreased strength  Visit Diagnosis: Chronic bilateral low back pain without sciatica  Decreased range of motion of trunk and back  Difficulty in walking, not elsewhere classified     Problem List Patient Active Problem List   Diagnosis Date Noted   Pulmonary embolism (HCC) 08/12/2018   DM (diabetes mellitus) (HCC) 08/12/2018   HTN (hypertension) 08/12/2018   Right leg DVT (HCC) 08/12/2018   Pulmonary nodules 08/12/2018   LUQ pain 07/09/2016   LLQ pain 07/09/2016   Chronic idiopathic constipation 07/09/2016   FH: celiac disease 07/09/2016   Murmur, cardiac 05/27/2013   FATTY LIVER DISEASE 03/09/2010   History of colonic polyps 03/09/2010   Becky Sax, LPTA/CLT; CBIS [phone]  Juel Burrow 07/12/2020, 11:27 AM  [facility] [address] [place], [place], [address] Phone: [phone]   Fax:  [phone]  Name: Rebecca Arellano MRN: [id number] Date of Birth: 19-Nov-1945

## 2020-07-19 ENCOUNTER — Encounter (HOSPITAL_COMMUNITY): Payer: Self-pay | Admitting: Physical Therapy

## 2020-07-19 ENCOUNTER — Ambulatory Visit (HOSPITAL_COMMUNITY): Payer: Medicare Other | Admitting: Physical Therapy

## 2020-07-19 ENCOUNTER — Other Ambulatory Visit: Payer: Self-pay

## 2020-07-19 DIAGNOSIS — R262 Difficulty in walking, not elsewhere classified: Secondary | ICD-10-CM

## 2020-07-19 DIAGNOSIS — M2569 Stiffness of other specified joint, not elsewhere classified: Secondary | ICD-10-CM

## 2020-07-19 DIAGNOSIS — G8929 Other chronic pain: Secondary | ICD-10-CM

## 2020-07-19 DIAGNOSIS — M545 Low back pain, unspecified: Secondary | ICD-10-CM | POA: Diagnosis not present

## 2020-07-19 NOTE — Therapy (Addendum)
[facility] [address] [place], [place], [address] Phone: [phone]   Fax:  [phone]  Physical Therapy Treatment and Progress Note  Patient Details  Name: Rebecca Arellano MRN: [id number] Date of Birth: [birth year] Referring Provider (PT): Earnie Larsson   Progress Note Reporting Period 05/25/20 to 07/19/20  See note below for Objective Data and Assessment of Progress/Goals.       Encounter Date: 07/19/2020   PT End of Session - 07/19/20 1043    Visit Number 17    Number of Visits 18    Date for PT Re-Evaluation 07/25/20    Authorization Type UHC Medicare; progress note completed visit #7    Authorization Time Period no auth no VL    Progress Note Due on Visit 17    PT Start Time 1045    PT Stop Time 1125    PT Time Calculation (min) 40 min    Activity Tolerance Patient tolerated treatment well;No increased pain    Behavior During Therapy WFL for tasks assessed/performed           Past Medical History:  Diagnosis Date  . Abnormal EKG   . Allergic rhinitis   . Asthma   . Chronic back pain   . DDD (degenerative disc disease), lumbar   . Degenerative lumbar disc   . Diabetes mellitus, type II ([place])   . Goiter   . Heart murmur   . Hypertension   . Hypothyroidism   . Other and unspecified hyperlipidemia   . Reflux     Past Surgical History:  Procedure Laterality Date  . 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  . COLONOSCOPY  02/2010   Dr. Gala Romney, left-sided diverticula, surveillance colonoscopy recommended for 5 years  . COLONOSCOPY WITH PROPOFOL N/A 08/06/2016   Procedure: COLONOSCOPY WITH PROPOFOL;  Surgeon: Daneil Dolin, MD;  [facility];  Service: Endoscopy;  Laterality: N/A;  7:30 am    There were no vitals filed for this visit.   Subjective Assessment - 07/19/20 1050    Subjective Pt reports overall improvement and rates a 95% improvement in LBP since  begining therapy. Patient reports she has MD f/u today regarding her LBP and bilateral hip pain.    Pertinent History DB, HTN, heart murmur    How long can you stand comfortably? 45 minutes    Patient Stated Goals would like to be able to stand an hour or two to be able to do dishes and clean house without having to take rest breaks    Currently in Pain? No/denies    Pain Score 0-No pain    Pain Location Hip    Pain Orientation Left    Pain Descriptors / Indicators Sore    Pain Type Chronic pain    Pain Onset More than a month ago              [facility] PT Assessment - 07/19/20 0001      Assessment   Medical Diagnosis LBP    Referring Provider (PT) Earnie Larsson    Next MD Visit 07/19/20      Observation/Other Assessments   Focus on Therapeutic Outcomes (FOTO)  63% function      AROM   Lumbar Flexion 25% limited    Lumbar Extension 25% limited      Special Tests    Special Tests Lumbar      FABER test   findings Negative  Side --   right/left     Straight Leg Raise   Findings Negative                         [facility] Adult PT Treatment/Exercise - 07/19/20 0001      Lumbar Exercises: Stretches   Single Knee to Chest Stretch Right;Left;3 reps;10 seconds      Lumbar Exercises: Standing   Heel Raises 10 reps   2 sets   Functional Squats 20 reps    Row Strengthening;Both;10 reps   2 sets single arm   Shoulder Extension Strengthening;Both;10 reps;Theraband    Theraband Level (Shoulder Extension) Level 4 (Blue)      Lumbar Exercises: Quadruped   Other Quadruped Lumbar Exercises quadruped shoulder taps 2x10 reps                  PT Education - 07/19/20 1125    Education Details pt educated on exisiting HEP and addition of quadruped. Discussion of assessment findings    Person(s) Educated Patient    Methods Explanation    Comprehension Verbalized understanding            PT Short Term Goals - 07/19/20 1134      PT SHORT TERM GOAL #1    Title Patient will be able to stand for at least 30 minutes to demonstrate improved standing endurance.    Baseline current 45 min tolerance    Time 3    Period Weeks    Status Achieved   can stand for 30 minutes, has pain after 20 minutes   Target Date 05/24/20      PT SHORT TERM GOAL #2   Title Patient will be independent in self management strategies to improve quality of life and functional outcomes.    Baseline 90% carryover    Time 3    Period Weeks    Status Achieved    Target Date 05/24/20      PT SHORT TERM GOAL #3   Title Patient will report at least 50% improvement in overall symptoms and/or function to demonstrate improved functional mobility    Baseline 12/21 patient reports 90% improvement    Time 3    Period Weeks    Status Achieved    Target Date 07/04/20             PT Long Term Goals - 07/19/20 1134      PT LONG TERM GOAL #1   Title Patient will be able to stand for at least 45 minutes to demonstrate improved standing endurance.    Baseline Patient able to stand for 45 minutes during session today before requesting a sitting break due to right hip pain increase.    Time 6    Period Weeks    Status Achieved      PT LONG TERM GOAL #2   Title Patient will report at least 50% improvement in overall symptoms and/or function to demonstrate improved functional mobility    Baseline reports 90%    Time 6    Period Weeks    Status Achieved      PT LONG TERM GOAL #3   Title Patient will improve on FOTO score to meet predicted outcomes to demonstrate improved functional mobility.    Baseline was 44% function ---> 63% function    Time 6    Period Weeks    Status On-going  Plan - 07/19/20 1126    Clinical Impression Statement Patient progressing well with POC details and has met STG/LTG and progressing with remaining details.  Patient reports global improvement in her activity levels at home and performing increased activities with less  pain and standing for longer periods.  Patient demonstrates 90% HEP recall with occasional verbal cues for proper sequence and form.    Personal Factors and Comorbidities Comorbidity 1;Comorbidity 2;Comorbidity 3+;Age    Comorbidities HTN, DB, heart murmur, chronic neck pain    Examination-Activity Limitations Bed Mobility;Transfers;Stairs;Stand;Squat;Lift;Locomotion Level;Reach Overhead    Examination-Participation Restrictions Community Activity;Meal Prep;Laundry;Yard Work;Shop;Cleaning    Stability/Clinical Decision Making Stable/Uncomplicated    Rehab Potential Good    PT Frequency 2x / week    PT Duration 6 weeks    PT Treatment/Interventions ADLs/Self Care Home Management;Aquatic Therapy;Functional mobility training;Patient/family education;Manual techniques;Therapeutic exercise;Therapeutic activities;Neuromuscular re-education;Dry needling;Passive range of motion;Gait training;Stair training;Cryotherapy;Electrical Stimulation;Traction;Moist Heat;Scar mobilization    PT Next Visit Plan Progress note due next session.  continue with functional strengthening and balance exercises increasing activity tolerance; advance HEP as able as 1x/wk PT sessions.    PT Home Exercise Plan SKC, prone hamstring curl 10/12 bridge; 10/14 SKC, LTR; 11/11: squat, heel raise and tandem stance; 06/13/20 - STS, SLS, SLS with vectors           Patient will benefit from skilled therapeutic intervention in order to improve the following deficits and impairments:  Pain,Difficulty walking,Decreased mobility,Decreased range of motion,Increased edema,Decreased endurance,Decreased skin integrity,Decreased strength  Visit Diagnosis: Decreased range of motion of trunk and back  Difficulty in walking, not elsewhere classified  Chronic bilateral low back pain without sciatica     Problem List Patient Active Problem List   Diagnosis Date Noted  . Pulmonary embolism ([place]) 08/12/2018  . DM (diabetes mellitus) ([place])  08/12/2018  . HTN (hypertension) 08/12/2018  . Right leg DVT ([place]) 08/12/2018  . Pulmonary nodules 08/12/2018  . LUQ pain 07/09/2016  . LLQ pain 07/09/2016  . Chronic idiopathic constipation 07/09/2016  . FH: celiac disease 07/09/2016  . Murmur, cardiac 05/27/2013  . FATTY LIVER DISEASE 03/09/2010  . History of colonic polyps 03/09/2010    Toniann Fail 07/19/2020, 11:37 AM   11:43 AM, 07/19/20 Jerene Pitch, DPT Physical Therapy with [facility]  [phone] office   [place], [place], [address] Phone: [phone]   Fax:  [phone]  Name: Rebecca Arellano MRN: [id number] Date of Birth: [birth year]

## 2020-07-27 ENCOUNTER — Other Ambulatory Visit: Payer: Self-pay

## 2020-07-27 ENCOUNTER — Ambulatory Visit (HOSPITAL_COMMUNITY): Payer: Medicare Other

## 2020-07-27 ENCOUNTER — Encounter (HOSPITAL_COMMUNITY): Payer: Self-pay

## 2020-07-27 DIAGNOSIS — R262 Difficulty in walking, not elsewhere classified: Secondary | ICD-10-CM

## 2020-07-27 DIAGNOSIS — G8929 Other chronic pain: Secondary | ICD-10-CM

## 2020-07-27 DIAGNOSIS — M2569 Stiffness of other specified joint, not elsewhere classified: Secondary | ICD-10-CM

## 2020-07-27 DIAGNOSIS — M545 Low back pain, unspecified: Secondary | ICD-10-CM | POA: Diagnosis not present

## 2020-07-27 NOTE — Therapy (Signed)
[facility] [address] [place], [place], [address] Phone: [phone]   Fax:  [phone]  Physical Therapy Treatment and Discharge Summary   Patient Details  Name: Rebecca Arellano MRN: [id number] Date of Birth: [birth year] Referring Provider (PT): [place] SUMMARY  Visits from Start of Care: 18  Current functional level related to goals / functional outcomes: See below for details. Able to meet all STG/LTG and demonstrates functional improvements   Remaining deficits: None   Education / Equipment: Patient provided with comprehensive HEP with written/visual materials for reference Plan: Patient agrees to discharge.  Patient goals were met. Patient is being discharged due to meeting the stated rehab goals.  ?????        Encounter Date: 07/27/2020   PT End of Session - 07/27/20 1030    Visit Number 18    Number of Visits 18    Date for PT Re-Evaluation 07/25/20    Authorization Type UHC Medicare; progress note completed visit #7    Authorization Time Period no auth no VL    Progress Note Due on Visit 17    PT Start Time 1030    PT Stop Time 1110    PT Time Calculation (min) 40 min    Activity Tolerance Patient tolerated treatment well;No increased pain    Behavior During Therapy WFL for tasks assessed/performed           Past Medical History:  Diagnosis Date  . Abnormal EKG   . Allergic rhinitis   . Asthma   . Chronic back pain   . DDD (degenerative disc disease), lumbar   . Degenerative lumbar disc   . Diabetes mellitus, type II ([place])   . Goiter   . Heart murmur   . Hypertension   . Hypothyroidism   . Other and unspecified hyperlipidemia   . Reflux     Past Surgical History:  Procedure Laterality Date  . 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  . COLONOSCOPY  02/2010   Dr. Gala Romney, left-sided diverticula, surveillance colonoscopy  recommended for 5 years  . COLONOSCOPY WITH PROPOFOL N/A 08/06/2016   Procedure: COLONOSCOPY WITH PROPOFOL;  Surgeon: Daneil Dolin, MD;  [facility];  Service: Endoscopy;  Laterality: N/A;  7:30 am    There were no vitals filed for this visit.   Subjective Assessment - 07/27/20 1044    Subjective Patient reports she had to reschedule MD appointment but presents today with no c/o new symptoms and reports continued overall improvement with her pain and function.    Pertinent History DB, HTN, heart murmur    How long can you stand comfortably? 45 minutes    Patient Stated Goals would like to be able to stand an hour or two to be able to do dishes and clean house without having to take rest breaks    Currently in Pain? No/denies    Pain Score 0-No pain    Pain Onset More than a month ago              [facility] PT Assessment - 07/27/20 0001      Assessment   Medical Diagnosis LBP    Referring Provider (PT) Earnie Larsson      Observation/Other Assessments   Focus on Therapeutic Outcomes (FOTO)  63% function      AROM   Lumbar Flexion 25% limited    Lumbar Extension 25% limited  Ambulation/Gait   Ambulation/Gait Yes    Ambulation/Gait Assistance 7: Independent    Ambulation Distance (Feet) 440 Feet    Gait Pattern Within Functional Limits    Gait velocity WNL    Gait Comments 2MWT                         [facility] Adult PT Treatment/Exercise - 07/27/20 0001      Lumbar Exercises: Aerobic   Recumbent Bike warm-up x 4 min      Lumbar Exercises: Standing   Heel Raises 10 reps    Functional Squats 20 reps    Row Strengthening;Both;10 reps    Other Standing Lumbar Exercises hip glides 10x 5"                  PT Education - 07/27/20 1046    Education Details Review and education on existing HEP to preapre for D/C to home based program.  Pt educated on benefits of routine walking program and use of trekking poles to offer support to improve  ambulation tolerance.    Person(s) Educated Patient    Methods Explanation;Demonstration    Comprehension Verbalized understanding;Returned demonstration            PT Short Term Goals - 07/27/20 1051      PT SHORT TERM GOAL #1   Title Patient will be able to stand for at least 30 minutes to demonstrate improved standing endurance.    Baseline current 45 min tolerance    Time 3    Period Weeks    Status Achieved   can stand for 30 minutes, has pain after 20 minutes   Target Date 05/24/20      PT SHORT TERM GOAL #2   Title Patient will be independent in self management strategies to improve quality of life and functional outcomes.    Baseline 90% carryover    Time 3    Period Weeks    Status Achieved    Target Date 05/24/20      PT SHORT TERM GOAL #3   Title Patient will report at least 50% improvement in overall symptoms and/or function to demonstrate improved functional mobility    Baseline 12/21 patient reports 90% improvement    Time 3    Period Weeks    Status Achieved    Target Date 07/04/20             PT Long Term Goals - 07/27/20 1051      PT LONG TERM GOAL #1   Title Patient will be able to stand for at least 45 minutes to demonstrate improved standing endurance.    Baseline Patient able to stand for 45 minutes during session today before requesting a sitting break due to right hip pain increase.    Time 6    Period Weeks    Status Achieved      PT LONG TERM GOAL #2   Title Patient will report at least 50% improvement in overall symptoms and/or function to demonstrate improved functional mobility    Baseline reports 95%    Time 6    Period Weeks    Status Achieved      PT LONG TERM GOAL #3   Title Patient will improve on FOTO score to meet predicted outcomes to demonstrate improved functional mobility.    Baseline was 44% function ---> 63% function    Time 6    Period Weeks    Status  Achieved                 Plan - 07/27/20 1048     Clinical Impression Statement Patient has done very well with POC details and has met STG/LTG and demonstrates improved carryover of HEP for general strengthening and core strengthening exercises to improve functional activity tolerance.  Patient will D/C to home-based program at this time and encouraged to maintain HEP and add walking routine    Personal Factors and Comorbidities Comorbidity 1;Comorbidity 2;Comorbidity 3+;Age    Comorbidities HTN, DB, heart murmur, chronic neck pain    Examination-Activity Limitations Bed Mobility;Transfers;Stairs;Stand;Squat;Lift;Locomotion Level;Reach Overhead    Examination-Participation Restrictions Community Activity;Meal Prep;Laundry;Yard Work;Shop;Cleaning    Stability/Clinical Decision Making Stable/Uncomplicated    Rehab Potential Good    PT Frequency 2x / week    PT Duration 6 weeks    PT Treatment/Interventions ADLs/Self Care Home Management;Aquatic Therapy;Functional mobility training;Patient/family education;Manual techniques;Therapeutic exercise;Therapeutic activities;Neuromuscular re-education;Dry needling;Passive range of motion;Gait training;Stair training;Cryotherapy;Electrical Stimulation;Traction;Moist Heat;Scar mobilization    PT Next Visit Plan D/C to HEP    PT Home Exercise Plan SKC, prone hamstring curl 10/12 bridge; 10/14 SKC, LTR; 11/11: squat, heel raise and tandem stance; 06/13/20 - STS, SLS, SLS with vectors. Bodyweight LE PRE. Encouraged to initiate walking routine           Patient will benefit from skilled therapeutic intervention in order to improve the following deficits and impairments:  Pain,Difficulty walking,Decreased mobility,Decreased range of motion,Increased edema,Decreased endurance,Decreased skin integrity,Decreased strength  Visit Diagnosis: Decreased range of motion of trunk and back  Difficulty in walking, not elsewhere classified  Chronic bilateral low back pain without sciatica     Problem List Patient  Active Problem List   Diagnosis Date Noted  . Pulmonary embolism ([place]) 08/12/2018  . DM (diabetes mellitus) ([place]) 08/12/2018  . HTN (hypertension) 08/12/2018  . Right leg DVT ([place]) 08/12/2018  . Pulmonary nodules 08/12/2018  . LUQ pain 07/09/2016  . LLQ pain 07/09/2016  . Chronic idiopathic constipation 07/09/2016  . FH: celiac disease 07/09/2016  . Murmur, cardiac 05/27/2013  . FATTY LIVER DISEASE 03/09/2010  . History of colonic polyps 03/09/2010    11:17 AM, 07/27/20 M. Sherlyn Lees, PT, DPT Physical Therapist- [place] Office Number: [phone]  [facility] [address] [place], [place], [address] Phone: [phone]   Fax:  [phone]  Name: Rebecca Arellano MRN: [id number] Date of Birth: 1946/04/23

## 2020-08-23 ENCOUNTER — Other Ambulatory Visit (HOSPITAL_COMMUNITY): Payer: Self-pay | Admitting: Nurse Practitioner

## 2020-08-23 ENCOUNTER — Other Ambulatory Visit (HOSPITAL_COMMUNITY): Payer: Self-pay | Admitting: Otolaryngology

## 2020-08-23 DIAGNOSIS — R634 Abnormal weight loss: Secondary | ICD-10-CM

## 2020-08-23 DIAGNOSIS — R630 Anorexia: Secondary | ICD-10-CM

## 2020-08-29 ENCOUNTER — Ambulatory Visit (HOSPITAL_COMMUNITY)
Admission: RE | Admit: 2020-08-29 | Discharge: 2020-08-29 | Disposition: A | Discharge status: Home / Self Care | Payer: Medicare Other | Source: Ambulatory Visit | Attending: Nurse Practitioner | Admitting: Nurse Practitioner

## 2020-08-29 ENCOUNTER — Other Ambulatory Visit: Payer: Self-pay

## 2020-08-29 ENCOUNTER — Ambulatory Visit (HOSPITAL_COMMUNITY): Payer: Medicare Other

## 2020-08-29 DIAGNOSIS — R630 Anorexia: Secondary | ICD-10-CM | POA: Insufficient documentation

## 2020-08-29 DIAGNOSIS — R634 Abnormal weight loss: Secondary | ICD-10-CM | POA: Insufficient documentation

## 2020-10-03 ENCOUNTER — Other Ambulatory Visit: Payer: Self-pay | Admitting: Family Medicine

## 2020-10-03 ENCOUNTER — Other Ambulatory Visit (HOSPITAL_COMMUNITY): Payer: Self-pay | Admitting: Family Medicine

## 2020-10-03 DIAGNOSIS — R634 Abnormal weight loss: Secondary | ICD-10-CM

## 2020-10-03 DIAGNOSIS — K869 Disease of pancreas, unspecified: Secondary | ICD-10-CM

## 2020-10-09 NOTE — ED Provider Notes (Incomplete)
[facility] Emergency Department Provider Note ____________________________________________   Event Date/Time   First MD Initiated Contact with Patient 10/09/20 2156     (approximate)  I have reviewed the triage vital signs and the nursing notes.   HISTORY  Chief Complaint leg pain   Historian Mother  HPI Rebecca Arellano is a 75 y.o. female reports to the emergency department with mother for evaluation of left lower extremity injury.  Patient was jumping on trampoline with older brother, when he accidentally landed on an unknown portion of her left leg.  The mother did not witness the incident, and brother carried the patient inside the house to mother.  The mother noted that she would not place any weight on the left lower extremity.  Denies any previous injury to the left lower extremity.  Of note, the history is limited by the patient's lack of verbal communication.  She does not answer questions well and is not able to point to an area or communicate an area that hurts.  Mother states that she brought up her communication concerns to the pediatrician, however the patient does not have any known diagnoses affecting communication at this time.  History reviewed. No pertinent past medical history.  Immunizations up to date:  Yes.    Patient Active Problem List   Diagnosis Date Noted  . Hyperbilirubinemia, neonatal 10/28/2017  . Term birth of infant 10/26/2017    No past surgical history on file.  Prior to Admission medications   Medication Sig Start Date End Date Taking? Authorizing Provider  sodium chloride (OCEAN) 0.65 % SOLN nasal spray Place 1 spray into both nostrils 2 (two) times daily. 09/12/19   Tommie Sams, DO    Allergies Patient has no known allergies.  No family history on file.  Social History Social History   Tobacco Use  . Smoking status: Never Smoker  . Smokeless tobacco: Never Used  Vaping Use  . Vaping Use: Never used   Substance Use Topics  . Alcohol use: Never  . Drug use: Never    Review of Systems Constitutional: No fever.  Baseline level of activity. Eyes: No visual changes.  No red eyes/discharge. ENT: No sore throat.  Not pulling at ears. Cardiovascular: Negative for chest pain/palpitations. Respiratory: Negative for shortness of breath. Gastrointestinal: No abdominal pain.  No nausea, no vomiting.  No diarrhea.  No constipation. Genitourinary: Negative for dysuria.  Normal urination. Musculoskeletal: + Left lower extremity injury, negative for back pain. Skin: Negative for rash. Neurological: Negative for headaches, focal weakness or numbness.    ____________________________________________   PHYSICAL EXAM:  VITAL SIGNS: ED Triage Vitals  Enc Vitals Group     BP --      Pulse Rate 10/09/20 2112 110     Resp 10/09/20 2112 30     Temp 10/09/20 2112 (!) 97.4 F (36.3 C)     Temp Source 10/09/20 2112 Axillary     SpO2 10/09/20 2112 100 %     Weight 10/09/20 2113 32 lb 13.6 oz (14.9 kg)     Height --      Head Circumference --      Peak Flow --      Pain Score --      Pain Loc --      Pain Edu? --      Excl. in GC? --    Constitutional: Alert, attentive, and oriented appropriately for age. Well appearing and in no acute distress. Eyes: Conjunctivae are  normal. PERRL. EOMI. Head: Atraumatic and normocephalic. Nose: No congestion/rhinorrhea. Neck: No stridor.   Musculoskeletal: Patient noted to toe down weight-bear with mild amount of weight only, will not place the foot flat on the ground.  When lying in the bed, and able to palpate all aspects of the left lower extremity including hip joint, femur, knee, ankle and foot without the patient appearing distressed or crying.  Hips able to be flexed without increased distress, knee and ankle able to be moved without distress.  Dorsal pedal pulse 2+, capillary refill less than 3 seconds.  Patient noted to be giggling during multiple  portions of palpation. Neurologic:  Appropriate for age. No gross focal neurologic deficits are appreciated.  Skin:  Skin is warm, dry and intact. No rash noted.  ____________________________________________  RADIOLOGY  No fracture able to be identified in the left femur, tib/fib or foot. ____________________________________________   PROCEDURES    Procedures    ____________________________________________   INITIAL IMPRESSION / ASSESSMENT AND PLAN / ED COURSE  As part of my medical decision making, I reviewed the following data within the electronic MEDICAL RECORD NUMBER {[id number]::"Notes from prior ED visits"," Controlled Substance Database"}   ***      ____________________________________________   FINAL CLINICAL IMPRESSION(S) / ED DIAGNOSES  Final diagnoses:  Injury  Left leg pain  Does not bear weight     ED Discharge Orders    None      Note:  This document was prepared using Dragon voice recognition software and may include unintentional dictation errors.

## 2020-10-27 ENCOUNTER — Ambulatory Visit (HOSPITAL_COMMUNITY)
Admission: RE | Admit: 2020-10-27 | Discharge: 2020-10-27 | Disposition: A | Discharge status: Home / Self Care | Payer: Medicare Other | Source: Ambulatory Visit | Attending: Family Medicine | Admitting: Family Medicine

## 2020-10-27 DIAGNOSIS — R634 Abnormal weight loss: Secondary | ICD-10-CM

## 2020-10-27 DIAGNOSIS — K869 Disease of pancreas, unspecified: Secondary | ICD-10-CM

## 2020-10-27 LAB — POCT I-STAT CREATININE: Creatinine, Ser: 0.7 mg/dL (ref 0.44–1.00)

## 2020-10-27 MED ORDER — IOHEXOL 300 MG/ML  SOLN
100.0000 mL | Freq: Once | INTRAMUSCULAR | Status: AC | PRN
  Administered 2020-10-27: 100 mL via INTRAVENOUS

## 2020-10-28 ENCOUNTER — Other Ambulatory Visit (HOSPITAL_COMMUNITY): Payer: Self-pay | Admitting: Family Medicine

## 2020-10-28 DIAGNOSIS — R634 Abnormal weight loss: Secondary | ICD-10-CM

## 2020-10-28 DIAGNOSIS — K869 Disease of pancreas, unspecified: Secondary | ICD-10-CM

## 2021-01-25 ENCOUNTER — Encounter: Payer: Self-pay | Admitting: Cardiology

## 2021-01-25 ENCOUNTER — Encounter (INDEPENDENT_AMBULATORY_CARE_PROVIDER_SITE_OTHER): Payer: Self-pay

## 2021-01-25 ENCOUNTER — Ambulatory Visit: Payer: Medicare Other | Admitting: Cardiology

## 2021-01-25 ENCOUNTER — Other Ambulatory Visit: Payer: Self-pay

## 2021-01-25 VITALS — BP 130/60 | HR 98 | Ht 62.0 in | Wt 149.0 lb

## 2021-01-25 DIAGNOSIS — E782 Mixed hyperlipidemia: Secondary | ICD-10-CM | POA: Diagnosis not present

## 2021-01-25 DIAGNOSIS — I2510 Atherosclerotic heart disease of native coronary artery without angina pectoris: Secondary | ICD-10-CM | POA: Diagnosis not present

## 2021-01-25 DIAGNOSIS — E1165 Type 2 diabetes mellitus with hyperglycemia: Secondary | ICD-10-CM | POA: Diagnosis not present

## 2021-01-25 NOTE — Addendum Note (Signed)
Addended by: Leonides Schanz C on: 01/25/2021 02:06 PM   Modules accepted: Orders

## 2021-01-25 NOTE — Patient Instructions (Signed)
Medication Instructions:  Your physician recommends that you continue on your current medications as directed. Please refer to the Current Medication list given to you today.  *If you need a refill on your cardiac medications before your next appointment, please call your pharmacy*   Lab Work: None If you have labs (blood work) drawn today and your tests are completely normal, you will receive your results only by: MyChart Message (if you have MyChart) OR A paper copy in the mail If you have any lab test that is abnormal or we need to change your treatment, we will call you to review the results.   Testing/Procedures: Your physician has requested that you have a lexiscan myoview. For further information please visit [email or web address] Please follow instruction sheet, as given.    Follow-Up: At [facility], you and your health needs are our priority.  As part of our continuing mission to provide you with exceptional heart care, we have created designated Provider Care Teams.  These Care Teams include your primary Cardiologist (physician) and Advanced Practice Providers (APPs -  Physician Assistants and Nurse Practitioners) who all work together to provide you with the care you need, when you need it.  We recommend signing up for the patient portal called "MyChart".  Sign up information is provided on this After Visit Summary.  MyChart is used to connect with patients for Virtual Visits (Telemedicine).  Patients are able to view lab/test results, encounter notes, upcoming appointments, etc.  Non-urgent messages can be sent to your provider as well.   To learn more about what you can do with MyChart, go to [email or web address].    Your next appointment:   Follow Up- Pending  Other Instructions

## 2021-01-25 NOTE — Progress Notes (Signed)
Cardiology Office Note  Date: 01/25/2021   ID: Talon, Witting 28-May-1946, MRN [id number]  PCP:  Gareth Morgan, MD  Cardiologist:  Nona Dell, MD Electrophysiologist:  None   Chief Complaint  Patient presents with   Atherosclerosis by CT     History of Present Illness: Rebecca Arellano is a 75 y.o. female referred for cardiology consultation by Dr. Sudie Bailey given findings of aortic and right coronary artery calcification by CT imaging.  She presents without any specific angina symptoms, does have chronic dyspnea on exertion which she attributes to "asthma."  I discussed the results of her abdominal and pelvic CT from March as it relates to the atherosclerotic findings.  She has not undergone any recent ischemic testing.  LVEF was normal at 55 to 60% by echocardiogram in 2020.  I reviewed her medications which are noted below.  I also reviewed her ECG today which shows sinus rhythm with left anterior fascicular block and decreased R wave progression.  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   COLONOSCOPY  02/2010   Dr. Jena Gauss, left-sided diverticula, surveillance colonoscopy recommended for 5 years   COLONOSCOPY WITH PROPOFOL N/A 08/06/2016   Procedure: COLONOSCOPY WITH PROPOFOL;  Surgeon: Corbin Ade, MD;  Location: [facility];  Service: Endoscopy;  Laterality: N/A;  7:30 am    Current Outpatient Medications  Medication Sig Dispense Refill   albuterol (VENTOLIN HFA) 108 (90 Base) MCG/ACT inhaler Inhale 2 puffs into the lungs every 4 (four)  hours as needed for wheezing or shortness of breath.     amLODipine (NORVASC) 10 MG tablet Take 1 tablet by mouth daily.     aspirin 81 MG EC tablet      atorvastatin (LIPITOR) 80 MG tablet Take 80 mg by mouth daily.     clonazePAM (KLONOPIN) 0.5 MG tablet      FIBER ADULT GUMMIES PO Take 1 tablet by mouth daily.     levothyroxine (SYNTHROID, LEVOTHROID) 100 MCG tablet Take 1 tablet by mouth daily.     lisinopril (PRINIVIL,ZESTRIL) 20 MG tablet Take 1 tablet (20 mg total) by mouth daily. 30 tablet 6   metFORMIN (GLUCOPHAGE) 1000 MG tablet Take 1 tablet by mouth 2 (two) times daily.     Probiotic Product (PROBIOTIC DAILY PO) Take 1 tablet by mouth every evening.      traMADol (ULTRAM) 50 MG tablet Take 50 mg by mouth 4 (four) times daily as needed for moderate pain.      No current facility-administered medications for this visit.   Allergies:  Keflex [cephalexin]   Social History: The patient  reports that she has never smoked. She has never used smokeless tobacco. She reports that she does not drink alcohol and does not use drugs.   Family History: The patient's family history includes Celiac disease in her brother; Colitis in her mother; Irritable bowel syndrome in her brother; Pancreatitis in her brother.   ROS: Anxiety.  Physical Exam:  VS:  BP 130/60   Pulse 98   Ht 5\' 2"  (1.575 m)   Wt 149 lb (67.6 kg)   SpO2 98%   BMI 27.25 kg/m , BMI Body mass index is 27.25 kg/m.  Wt Readings from Last 3 Encounters:  01/25/21 149 lb (67.6 kg)  08/12/18 176 lb (79.8 kg)  11/07/16 167 lb (75.8 kg)    General: Patient appears comfortable at rest. HEENT: Conjunctiva and lids normal, wearing a mask. Neck: Supple, no elevated JVP or carotid bruits, no thyromegaly. Lungs: Clear to auscultation, nonlabored breathing at rest. Cardiac: Regular rate and rhythm, no S3, 1/6 systolic murmur, no pericardial rub. Abdomen: Soft, nontender, bowel sounds present. Extremities: No pitting edema, distal  pulses 2+. Skin: Warm and dry. Musculoskeletal: No kyphosis. Neuropsychiatric: Alert and oriented x3, affect grossly appropriate.  ECG:  An ECG dated 08/12/2018 was personally reviewed today and demonstrated:  Sinus rhythm with increased voltage, decreased R wave progression, left anterior fascicular block.  Recent Labwork: 10/27/2020: Creatinine, Ser 0.70  January 2022: Potassium 4.1, BUN 14, creatinine 0.8, hemoglobin A1c 7.8%  Other Studies Reviewed Today:  Echocardiogram 08/13/2018: - Left ventricle: The cavity size was normal. Wall thickness was    increased in a pattern of mild LVH. Systolic function was normal.    The estimated ejection fraction was in the range of 55% to 60%.    Wall motion was normal; there were no regional wall motion    abnormalities. Indeterminate diastolic function.  - Aortic valve: Probable lambl&'s excrescence at leaflet tip (normal    variant).  - Mitral valve: There was trivial regurgitation.  - Right atrium: Central venous pressure (est): 3 mm Hg.  - Tricuspid valve: There was physiologic regurgitation.  - Pulmonary arteries: Systolic pressure could not be accurately    estimated.  - Pericardium, extracardiac: A prominent pericardial fat pad was    present.   Abdominal and pelvic CT 10/27/2020: IMPRESSION: 1. No acute findings in the abdomen to account for the patient's symptoms. The pelvis was not imaged. The patient does have evidence of colonic diverticulosis. If there is clinical concern for acute diverticulitis in the pelvis, further evaluation with pelvic CT would be recommended. 2. 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 3. Aortic atherosclerosis, in addition to at least right coronary artery disease. Assessment for potential risk factor modification, dietary therapy or pharmacologic therapy may be warranted, if clinically indicated.  Assessment and Plan:  1.  Coronary artery calcification by CT  imaging in March as noted above.  She has chronic shortness of breath, no obvious angina symptoms.  ECG reviewed and nonspecific.  Baseline comorbidities include type 2 diabetes mellitus and hyperlipidemia.  Plan to proceed with a Lexiscan Myoview to evaluate ischemic burden.  We will continue statin therapy and aim for LDL under 70.  She is also on low-dose aspirin.  2.  Type 2 diabetes mellitus, currently on Glucophage with follow-up by PCP.  Hemoglobin A1c was 7.8% in January.  She would be a good candidate for SGLT2 inhibitor such as Jardiance to further reduce cardiac risk.  Medication Adjustments/Labs and Tests Ordered: Current medicines are reviewed at length with the patient today.  Concerns regarding medicines are outlined above.   Tests Ordered: Orders Placed This Encounter  Procedures   NM Myocar Multi W/Spect W/Wall Motion / EF     Medication Changes: No orders of the defined types were placed in this encounter.   Disposition:  Follow up  test results.  Signed, Jonelle Sidle, MD, [facility] 01/25/2021 2:01 PM    [facility] [address] [place], [place] [address] Phone: [phone]; Fax: [phone]

## 2021-02-08 ENCOUNTER — Ambulatory Visit (HOSPITAL_COMMUNITY)
Admission: RE | Admit: 2021-02-08 | Discharge: 2021-02-08 | Disposition: A | Discharge status: Home / Self Care | Payer: Medicare Other | Source: Ambulatory Visit | Attending: Cardiology | Admitting: Cardiology

## 2021-02-08 ENCOUNTER — Telehealth: Payer: Self-pay

## 2021-02-08 ENCOUNTER — Encounter (HOSPITAL_COMMUNITY)
Admission: RE | Admit: 2021-02-08 | Discharge: 2021-02-08 | Disposition: A | Discharge status: Home / Self Care | Payer: Medicare Other | Source: Ambulatory Visit | Attending: Cardiology | Admitting: Cardiology

## 2021-02-08 ENCOUNTER — Other Ambulatory Visit: Payer: Self-pay

## 2021-02-08 DIAGNOSIS — I2510 Atherosclerotic heart disease of native coronary artery without angina pectoris: Secondary | ICD-10-CM | POA: Diagnosis not present

## 2021-02-08 LAB — NM MYOCAR MULTI W/SPECT W/WALL MOTION / EF
LV dias vol: 62 mL (ref 46–106)
LV sys vol: 19 mL
Peak HR: 112 {beats}/min
RATE: 0.38
Rest HR: 93 {beats}/min
SDS: 2
SRS: 1
SSS: 3
TID: 0.93

## 2021-02-08 MED ORDER — TECHNETIUM TC 99M TETROFOSMIN IV KIT
30.0000 | PACK | Freq: Once | INTRAVENOUS | Status: AC | PRN
  Administered 2021-02-08: 31.3 via INTRAVENOUS

## 2021-02-08 MED ORDER — TECHNETIUM TC 99M TETROFOSMIN IV KIT
10.0000 | PACK | Freq: Once | INTRAVENOUS | Status: AC | PRN
  Administered 2021-02-08: 11 via INTRAVENOUS

## 2021-02-08 MED ORDER — SODIUM CHLORIDE FLUSH 0.9 % IV SOLN
INTRAVENOUS | Status: AC
  Administered 2021-02-08: 10 mL via INTRAVENOUS
  Filled 2021-02-08: qty 10

## 2021-02-08 MED ORDER — REGADENOSON 0.4 MG/5ML IV SOLN
INTRAVENOUS | Status: AC
  Administered 2021-02-08: 0.4 mg via INTRAVENOUS
  Filled 2021-02-08: qty 5

## 2021-02-08 NOTE — Telephone Encounter (Signed)
Pt notified and verbalized understanding. Pt had no questions or concerns at this time.  

## 2021-02-08 NOTE — Telephone Encounter (Signed)
-----   Message from Jonelle Sidle, MD sent at 02/08/2021  3:37 PM EDT ----- Results reviewed.  Please let her know that the stress test showed no ischemic territories to suggest obstructive CAD despite findings of coronary calcifications by CT imaging.  Would continue with medical therapy.  We can see her back in 1 year unless symptoms intervene.

## 2021-05-16 ENCOUNTER — Encounter (HOSPITAL_COMMUNITY): Payer: Self-pay

## 2021-05-16 ENCOUNTER — Other Ambulatory Visit: Payer: Self-pay

## 2021-05-16 ENCOUNTER — Encounter (HOSPITAL_COMMUNITY)
Admission: RE | Admit: 2021-05-16 | Discharge: 2021-05-16 | Disposition: A | Discharge status: Home / Self Care | Payer: Medicare Other | Source: Ambulatory Visit | Attending: Ophthalmology | Admitting: Ophthalmology

## 2021-05-17 NOTE — H&P (Signed)
Surgical History & Physical  Patient Name: Rebecca Arellano DOB: 21-Oct-1945  Surgery: Cataract extraction with intraocular lens implant phacoemulsification; Right Eye & Insertion of aqueous drainage device, stent; Right Eye  Surgeon: [name] MD Surgery Date:  05-19-21 Pre-Op Date:  05-11-21  HPI: A 16 Yr. old female patient Pt is present for cataract evaluation. The patient complains of difficulty when driving at night, which began 2 years ago. Both eyes are affected. OD>OS. The episode is constant. The condition's severity is worsening. The complaint is associated with blurry vision ang glares. Symptoms are negatively affecting pt's quality of life. Pt using Vyzulta qhs OU. Pt denies any increase in floaters/flashes of light or eye pain HPI Completed by Dr. [name]  Medical History: Glaucoma Cataracts Macula Degeneration Glaucoma Chronic Back Pain Colonic diverticulosis Diabetes - DM Type 2 High Blood Pressure LDL Thyroid Problems  Review of Systems Negative Allergic/Immunologic Negative Cardiovascular Negative Constitutional Negative Ear, Nose, Mouth & Throat Negative Endocrine Negative Eyes Negative Gastrointestinal Negative Genitourinary Negative Hemotologic/Lymphatic Negative Integumentary Negative Musculoskeletal Negative Neurological Negative Psychiatry Negative Respiratory  Social   Never smoked   Medication Vyzulta,  Glipizide/metformin HCl, Levothyroxine Sodium, Lisinopril, Tramadol hydrochloride, Aspirin, Probiotic, Fiber, Aleve, Atorvastatin, Amlodipine Besylate,   Sx/Procedures  None  Drug Allergies   NKDA  History & Physical: Heent: Cataract, Right Eye & Glaucoma, Right Eye NECK: supple without bruits LUNGS: lungs clear to auscultation CV: regular rate and rhythm Abdomen: soft and non-tender  Impression & Plan: Assessment: 1.  NUCLEAR SCLEROSIS AGE RELATED; Both Eyes (H25.13) 2.  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  OAG BORDERLINE FINDINGS LOW RISK; Left Eye (H40.012)  Plan: 1.  Cataract accounts for the patient's decreased vision. This visual impairment is not correctable with a tolerable change in glasses or contact lenses. Cataract surgery with an implantation of a new lens should significantly improve the visual and functional status of the patient. Discussed all risks, benefits, alternatives, and potential complications. Discussed the procedures and recovery. Patient desires to have surgery. A-scan ordered and performed today for intra-ocular lens calculations. The surgery will be performed in order to improve vision for driving, reading, and for eye examinations. Recommend phacoemulsification with intra-ocular lens. Recommend Dextenza for post-operative pain and inflammation. Right Eye worse - first. Dilates poorly - shugarcaine by protocol. Omidira. Recommend iSent inject to control pressure in setting of glaucoma as above.  2.  Thick corneas. IOP at goal OU today. Not currently on any treatment. OCT rNFL shows superior thinning OD - stable compared to 1 year ago. HVF 24-2 shows nasal step OD, WNL OS (6/22) Given thinning on OCT and visual defect on HVF, concerned about progressive glaucoma. With progressive glaucoma despite medical therapy, recommend iStent Inject W to improve IOP control. Detailed discussion about glaucoma today including importance of maintaining good follow up and following treatment plan, and the possibility of irreversible blindness as part of this disease process.  3.  Based on cup-to-disc ratio. Thick corneas. Negative Family history. Detailed discussion about glaucoma today including importance of maintaining good follow up and following treatment plan, and the possibility of irreversible blindness as part of this disease process.

## 2021-05-19 ENCOUNTER — Ambulatory Visit (HOSPITAL_COMMUNITY): Payer: Medicare Other | Admitting: Anesthesiology

## 2021-05-19 ENCOUNTER — Other Ambulatory Visit: Payer: Self-pay

## 2021-05-19 ENCOUNTER — Encounter (HOSPITAL_COMMUNITY): Payer: Self-pay | Admitting: Ophthalmology

## 2021-05-19 ENCOUNTER — Encounter (HOSPITAL_COMMUNITY)
Admission: RE | Disposition: A | Discharge status: Home / Self Care | Payer: Self-pay | Source: Home / Self Care | Attending: Ophthalmology

## 2021-05-19 ENCOUNTER — Ambulatory Visit (HOSPITAL_COMMUNITY)
Admission: RE | Admit: 2021-05-19 | Discharge: 2021-05-19 | Disposition: A | Discharge status: Home / Self Care | Payer: Medicare Other | Attending: Ophthalmology | Admitting: Ophthalmology

## 2021-05-19 DIAGNOSIS — H401111 Primary open-angle glaucoma, right eye, mild stage: Secondary | ICD-10-CM | POA: Insufficient documentation

## 2021-05-19 DIAGNOSIS — E1136 Type 2 diabetes mellitus with diabetic cataract: Secondary | ICD-10-CM | POA: Diagnosis not present

## 2021-05-19 DIAGNOSIS — Z7984 Long term (current) use of oral hypoglycemic drugs: Secondary | ICD-10-CM | POA: Insufficient documentation

## 2021-05-19 DIAGNOSIS — Z79899 Other long term (current) drug therapy: Secondary | ICD-10-CM | POA: Insufficient documentation

## 2021-05-19 DIAGNOSIS — H40012 Open angle with borderline findings, low risk, left eye: Secondary | ICD-10-CM | POA: Diagnosis not present

## 2021-05-19 DIAGNOSIS — H18899 Other specified disorders of cornea, unspecified eye: Secondary | ICD-10-CM | POA: Insufficient documentation

## 2021-05-19 DIAGNOSIS — H2513 Age-related nuclear cataract, bilateral: Secondary | ICD-10-CM | POA: Insufficient documentation

## 2021-05-19 HISTORY — PX: INSERTION OF ANTERIOR SEGMENT AQUEOUS DRAINAGE DEVICE (ISTENT): SHX6783

## 2021-05-19 HISTORY — PX: CATARACT EXTRACTION W/PHACO: SHX586

## 2021-05-19 LAB — GLUCOSE, CAPILLARY: Glucose-Capillary: 139 mg/dL — ABNORMAL HIGH (ref 70–99)

## 2021-05-19 SURGERY — PHACOEMULSIFICATION, CATARACT, WITH IOL INSERTION
Anesthesia: Monitor Anesthesia Care | Site: Eye | Laterality: Right

## 2021-05-19 MED ORDER — PHENYLEPHRINE HCL 2.5 % OP SOLN
1.0000 [drp] | OPHTHALMIC | Status: AC | PRN
  Administered 2021-05-19 (×3): 1 [drp] via OPHTHALMIC

## 2021-05-19 MED ORDER — SODIUM HYALURONATE 23MG/ML IO SOSY
PREFILLED_SYRINGE | INTRAOCULAR | Status: DC | PRN
  Administered 2021-05-19: 0.6 mL via INTRAOCULAR

## 2021-05-19 MED ORDER — MIDAZOLAM HCL 5 MG/5ML IJ SOLN
INTRAMUSCULAR | Status: DC | PRN
  Administered 2021-05-19: 1 mg via INTRAVENOUS

## 2021-05-19 MED ORDER — FENTANYL CITRATE (PF) 100 MCG/2ML IJ SOLN
INTRAMUSCULAR | Status: DC | PRN
  Administered 2021-05-19: 50 ug via INTRAVENOUS

## 2021-05-19 MED ORDER — PHENYLEPHRINE-KETOROLAC 1-0.3 % IO SOLN
INTRAOCULAR | Status: DC | PRN
  Administered 2021-05-19: 500 mL via OPHTHALMIC

## 2021-05-19 MED ORDER — LIDOCAINE HCL (PF) 1 % IJ SOLN
INTRAOCULAR | Status: DC | PRN
  Administered 2021-05-19: 1 mL via OPHTHALMIC

## 2021-05-19 MED ORDER — POVIDONE-IODINE 5 % OP SOLN
OPHTHALMIC | Status: DC | PRN
  Administered 2021-05-19: 1 via OPHTHALMIC

## 2021-05-19 MED ORDER — EPINEPHRINE PF 1 MG/ML IJ SOLN
INTRAMUSCULAR | Status: AC
  Filled 2021-05-19: qty 1

## 2021-05-19 MED ORDER — BSS IO SOLN
INTRAOCULAR | Status: DC | PRN
  Administered 2021-05-19: 15 mL via INTRAOCULAR

## 2021-05-19 MED ORDER — SODIUM HYALURONATE 10 MG/ML IO SOLUTION
PREFILLED_SYRINGE | INTRAOCULAR | Status: DC | PRN
  Administered 2021-05-19: 0.85 mL via INTRAOCULAR

## 2021-05-19 MED ORDER — FENTANYL CITRATE (PF) 100 MCG/2ML IJ SOLN
INTRAMUSCULAR | Status: AC
  Filled 2021-05-19: qty 2

## 2021-05-19 MED ORDER — TROPICAMIDE 1 % OP SOLN
1.0000 [drp] | OPHTHALMIC | Status: AC | PRN
  Administered 2021-05-19 (×3): 1 [drp] via OPHTHALMIC

## 2021-05-19 MED ORDER — TETRACAINE HCL 0.5 % OP SOLN
1.0000 [drp] | OPHTHALMIC | Status: AC | PRN
  Administered 2021-05-19 (×3): 1 [drp] via OPHTHALMIC

## 2021-05-19 MED ORDER — MIDAZOLAM HCL 2 MG/2ML IJ SOLN
INTRAMUSCULAR | Status: AC
  Filled 2021-05-19: qty 2

## 2021-05-19 MED ORDER — NEOMYCIN-POLYMYXIN-DEXAMETH 3.5-10000-0.1 OP SUSP
OPHTHALMIC | Status: DC | PRN
  Administered 2021-05-19: 1 [drp]

## 2021-05-19 MED ORDER — LIDOCAINE HCL 3.5 % OP GEL
1.0000 "application " | Freq: Once | OPHTHALMIC | Status: AC
  Administered 2021-05-19: 1 via OPHTHALMIC

## 2021-05-19 MED ORDER — SODIUM CHLORIDE 0.9% FLUSH
INTRAVENOUS | Status: DC | PRN
  Administered 2021-05-19: 10 mL via INTRAVENOUS

## 2021-05-19 MED ORDER — MIDAZOLAM HCL 2 MG/2ML IJ SOLN
INTRAMUSCULAR | Status: AC
  Administered 2021-05-19: 2 mg via INTRAVENOUS
  Filled 2021-05-19: qty 2

## 2021-05-19 MED ORDER — PHENYLEPHRINE-KETOROLAC 1-0.3 % IO SOLN
INTRAOCULAR | Status: AC
  Filled 2021-05-19: qty 4

## 2021-05-19 MED ORDER — STERILE WATER FOR IRRIGATION IR SOLN
Status: DC | PRN
  Administered 2021-05-19: 250 mL

## 2021-05-19 MED ORDER — MIDAZOLAM HCL 2 MG/2ML IJ SOLN: 2.0000 mg | Freq: Once | INTRAMUSCULAR | Status: AC

## 2021-05-19 SURGICAL SUPPLY — 13 items
CLOTH BEACON ORANGE TIMEOUT ST (SAFETY) ×2 IMPLANT
DEVICE INJECT ISTENT W (Stent) ×1 IMPLANT
EYE SHIELD UNIVERSAL CLEAR (GAUZE/BANDAGES/DRESSINGS) ×2 IMPLANT
GLOVE SURG UNDER POLY LF SZ6.5 (GLOVE) ×2 IMPLANT
GLOVE SURG UNDER POLY LF SZ7 (GLOVE) ×2 IMPLANT
INJECT ISTENT W (Stent) ×2 IMPLANT
NEEDLE HYPO 18GX1.5 BLUNT FILL (NEEDLE) ×2 IMPLANT
PAD ARMBOARD 7.5X6 YLW CONV (MISCELLANEOUS) ×2 IMPLANT
RAYONE EMV US (Intraocular Lens) ×2 IMPLANT
SYR TB 1ML LL NO SAFETY (SYRINGE) ×2 IMPLANT
TAPE SURG TRANSPORE 1 IN (GAUZE/BANDAGES/DRESSINGS) ×1 IMPLANT
TAPE SURGICAL TRANSPORE 1 IN (GAUZE/BANDAGES/DRESSINGS) ×1
WATER STERILE IRR 250ML POUR (IV SOLUTION) ×2 IMPLANT

## 2021-05-19 NOTE — Op Note (Signed)
Date of procedure: 05/19/21  Pre-operative diagnosis:  1) Visually significant age-related nuclear cataract, Right Eye (H25.11) 2) Primary Open Angle Glaucoma, Mild Stage, Right Eye  Post-operative diagnosis: 1) Visually significant age-related cataract, Right Eye 2) Primary Open Angle Glaucoma,Mild Stage, Right Eye  Procedure:  1) Removal of cataract via phacoemulsification and insertion of intra-ocular lens Rayner [id number] +15.0D into the capsular bag of the Right Eye 2) Placement of iStent Inject W into the trabecular meshwork of the right eye  Attending surgeon: Gerda Diss. Mekaylah Klich, MD, MA  Anesthesia: MAC, Topical Akten  Complications: None  Estimated Blood Loss: <61m (minimal)  Specimens: None  Implants: As above  Indications:  Visually significant age-related cataract, Right Eye  Procedure:  The patient was seen and identified in the pre-operative area. The operative eye was identified and dilated.  The operative eye was marked.  Topical anesthesia was administered to the operative eye.     The patient was then to the operative suite and placed in the supine position.  A timeout was performed confirming the patient, procedure to be performed, and all other relevant information.   The patient's face was prepped and draped in the usual fashion for intra-ocular surgery.  A lid speculum was placed into the operative eye and the surgical microscope moved into place and focused.  A superotemporal paracentesis was created using a 20 gauge paracentesis blade.  Shugarcaine was injected into the anterior chamber.  Viscoelastic was injected into the anterior chamber.  A temporal clear-corneal main wound incision was created using a 2.442mmicrokeratome.  A continuous curvilinear capsulorrhexis was initiated using an irrigating cystitome and completed using capsulorrhexis forceps.  Hydrodissection and hydrodeliniation were performed.  Viscoelastic was injected into the anterior chamber.  A  phacoemulsification handpiece and a chopper as a second instrument were used to remove the nucleus and epinucleus. The irrigation/aspiration handpiece was used to remove any remaining cortical material.   The capsular bag was reinflated with viscoelastic, checked, and found to be intact.  The intraocular lens was inserted into the capsular bag and dialed into place using a Kuglen hook.    The patient's head was repositioned.  iStent inject W (2 stents) were placed 2 clock hours apart under gonioscopy in the nasal trabecular meshwork without complication.    The patient's head was returned to neutral.  The irrigation/aspiration handpiece was used to remove any remaining viscoelastic.  The clear corneal wound and paracentesis wounds were then hydrated and checked with Weck-Cels to be watertight.  The lid-speculum was removed.  The drape was removed.  The patient's face was cleaned with a wet and dry 4x4.  Maxitrol was instilled in the eye before a clear shield was taped over the eye. The patient was taken to the post-operative care unit in good condition, having tolerated the procedure well.  Post-Op Instructions: [facility] and will receive all other orders and instructions.

## 2021-05-19 NOTE — Transfer of Care (Signed)
Immediate Anesthesia Transfer of Care Note  Patient: Rebecca Arellano  Procedure(s) Performed: CATARACT EXTRACTION PHACO AND INTRAOCULAR LENS PLACEMENT (IOC) (Right: Eye) INSERTION OF ANTERIOR SEGMENT AQUEOUS DRAINAGE DEVICE (ISTENT) (Right: Eye)  Patient Location: Short Stay  Anesthesia Type:MAC  Level of Consciousness: awake  Airway & Oxygen Therapy: Patient Spontanous Breathing  Post-op Assessment: Report given to RN  Post vital signs: Reviewed  Last Vitals:  Vitals Value Taken Time  BP    Temp    Pulse    Resp    SpO2      Last Pain:  Vitals:   05/19/21 1212  TempSrc: Oral  PainSc: 0-No pain      Patients Stated Pain Goal: 5 (97/53/00 5110)  Complications: No notable events documented.

## 2021-05-19 NOTE — Interval H&P Note (Signed)
History and Physical Interval Note:  05/19/2021 12:15 PM  Rebecca Arellano  has presented today for surgery, with the diagnosis of nuclear cataract right eye.  The various methods of treatment have been discussed with the patient and family. After consideration of risks, benefits and other options for treatment, the patient has consented to  Procedure(s) with comments: CATARACT EXTRACTION PHACO AND INTRAOCULAR LENS PLACEMENT (IOC) (Right) - pt knows to arrive at 11:30 INSERTION OF ANTERIOR SEGMENT AQUEOUS DRAINAGE DEVICE (ISTENT) (Right) as a surgical intervention.  The patient's history has been reviewed, patient examined, no change in status, stable for surgery.  I have reviewed the patient's chart and labs.  Questions were answered to the patient's satisfaction.     [name]

## 2021-05-19 NOTE — Anesthesia Postprocedure Evaluation (Signed)
Anesthesia Post Note  Patient: Leonette Nutting  Procedure(s) Performed: CATARACT EXTRACTION PHACO AND INTRAOCULAR LENS PLACEMENT (IOC) (Right: Eye) INSERTION OF ANTERIOR SEGMENT AQUEOUS DRAINAGE DEVICE (ISTENT) (Right: Eye)  Patient location during evaluation: Short Stay Anesthesia Type: MAC Level of consciousness: awake and alert Pain management: pain level controlled Vital Signs Assessment: post-procedure vital signs reviewed and stable Respiratory status: spontaneous breathing Cardiovascular status: blood pressure returned to baseline and stable Postop Assessment: no apparent nausea or vomiting Anesthetic complications: no   No notable events documented.   Last Vitals:  Vitals:   05/19/21 1212 05/19/21 1221  BP:  (!) 158/85  Pulse:    Resp:  (!) 23  Temp: 36.8 C   SpO2:  100%    Last Pain:  Vitals:   05/19/21 1212  TempSrc: Oral  PainSc: 0-No pain                 Jesaiah Fabiano

## 2021-05-19 NOTE — Discharge Instructions (Addendum)
Please discharge patient when stable, will follow up today with Dr. Wrzosek at the [facility] office immediately following discharge.  Leave shield in place until visit.  All paperwork with discharge instructions will be given at the office.  [facility] Address:  [address]  , [place] [address]  

## 2021-05-19 NOTE — Anesthesia Preprocedure Evaluation (Signed)
Anesthesia Evaluation  Patient identified by MRN, date of birth, ID band Patient awake    Reviewed: Allergy & Precautions, NPO status , Patient's Chart, lab work & pertinent test results  Airway Mallampati: II  TM Distance: >3 FB Neck ROM: Full    Dental  (+) Dental Advisory Given, Chipped   Pulmonary asthma ,    Pulmonary exam normal breath sounds clear to auscultation       Cardiovascular Exercise Tolerance: Good hypertension, Pt. on medications + Valvular Problems/Murmurs  Rhythm:Regular Rate:Tachycardia     Neuro/Psych negative neurological ROS  negative psych ROS   GI/Hepatic Neg liver ROS, GERD  ,  Endo/Other  diabetes, Well Controlled, Type 2, Oral Hypoglycemic AgentsHypothyroidism   Renal/GU negative Renal ROS     Musculoskeletal  (+) Arthritis , Osteoarthritis,    Abdominal   Peds  Hematology   Anesthesia Other Findings   Reproductive/Obstetrics                             Anesthesia Physical  Anesthesia Plan  ASA: 2  Anesthesia Plan: MAC   Post-op Pain Management:    Induction:   PONV Risk Score and Plan:   Airway Management Planned: Nasal Cannula and Natural Airway  Additional Equipment:   Intra-op Plan:   Post-operative Plan:   Informed Consent: I have reviewed the patients History and Physical, chart, labs and discussed the procedure including the risks, benefits and alternatives for the proposed anesthesia with the patient or authorized representative who has indicated his/her understanding and acceptance.     Dental advisory given  Plan Discussed with: CRNA and Surgeon  Anesthesia Plan Comments:         Anesthesia Quick Evaluation  

## 2021-05-22 ENCOUNTER — Encounter (HOSPITAL_COMMUNITY): Payer: Self-pay | Admitting: Ophthalmology

## 2021-06-12 ENCOUNTER — Encounter (HOSPITAL_COMMUNITY)
Admission: RE | Admit: 2021-06-12 | Discharge: 2021-06-12 | Disposition: A | Discharge status: Home / Self Care | Payer: Medicare Other | Source: Ambulatory Visit | Attending: Ophthalmology | Admitting: Ophthalmology

## 2021-06-12 ENCOUNTER — Other Ambulatory Visit: Payer: Self-pay

## 2021-06-13 NOTE — H&P (Signed)
Surgical History & Physical  Patient Name: Rebecca Arellano DOB: [birth year]  Surgery: Cataract extraction with intraocular lens implant phacoemulsification; Left Eye  Surgeon: [name] MD Surgery Date:  06-16-21 Pre-Op Date:  05-25-21  HPI: A 30 Yr. old female patient 1. The patient is returning after cataract post-op, CEIOL w/ glaucoma surgery. The right eye is affected. Status post cataract post-op, which began 1 weeks ago: Since the last visit, the affected area is doing well. The patient's vision is improved. Patient is following medication instructions, the patient has been using the post op drops TID OD. The patient experiences no eye pain and no flashes, floater, shadow, curtain or veil. The patient still struggles with blurry vision OS, that is negatively affecting the patient's quality of life by making it difficult to read fine print on labels, and books, and the patient has difficulty seeing well to drive, especially at night time due to glare from oncoming headlights. HPI Completed by Dr. [name]  Medical History: Glaucoma Cataracts Macula Degeneration Glaucoma Chronic Back Pain Colonic diverticulosis Diabetes - DM Type 2 High Blood Pressure LDL Thyroid Problems  Review of Systems Negative Allergic/Immunologic Negative Cardiovascular Negative Constitutional Negative Ear, Nose, Mouth & Throat Negative Endocrine Negative Eyes Negative Gastrointestinal Negative Genitourinary Negative Hemotologic/Lymphatic Negative Integumentary Negative Musculoskeletal Negative Neurological Negative Psychiatry Negative Respiratory  Social   Never smoked   Medication Vyzulta, Prednisolone-Moxifloxacin-Bromfenac,Glipizide/metformin HCl, Levothyroxine Sodium, Lisinopril, Tramadol hydrochloride, Aspirin, Probiotic, Fiber, Aleve, Atorvastatin, Amlodipine Besylate,   Sx/Procedures Phaco c IOL OD-with Glaucoma,    Drug Allergies   NKDA  History & Physical: Heent:  Cataract, Left Eye NECK: supple without bruits LUNGS: lungs clear to auscultation CV: regular rate and rhythm Abdomen: soft and non-tender  Impression & Plan: Assessment: 1.  CATARACT EXTRACTION STATUS; Right Eye (Z98.41) 2.  INTRAOCULAR LENS IOL ; Right Eye (Z96.1) 3.  NUCLEAR SCLEROSIS AGE RELATED; , Left Eye (H25.12) 4.  Myopia ; Left Eye (H52.12)  Plan: 1.  1 week after cataract surgery. Doing well with improved vision and normal eye pressure. Call with any problems or concerns. Continue Pred-Moxi-Brom 2x/day for 3 more weeks. Continue Vyzulta.  2.  Doing well since surgery Continue Post-op medications  3.  Cataract accounts for the patient's decreased vision. This visual impairment is not correctable with a tolerable change in glasses or contact lenses. Cataract surgery with an implantation of a new lens should significantly improve the visual and functional status of the patient. Discussed all risks, benefits, alternatives, and potential complications. Discussed the procedures and recovery. Patient desires to have surgery. A-scan ordered and performed today for intra-ocular lens calculations. The surgery will be performed in order to improve vision for driving, reading, and for eye examinations. Recommend phacoemulsification with intra-ocular lens. Recommend Dextenza for post-operative pain and inflammation. Left Eye Surgery required to balance vision. Dilates poorly - shugarcaine by protocol. Omidira.  4.

## 2021-06-16 ENCOUNTER — Ambulatory Visit (HOSPITAL_COMMUNITY): Payer: Medicare Other | Admitting: Certified Registered"

## 2021-06-16 ENCOUNTER — Encounter (HOSPITAL_COMMUNITY): Payer: Self-pay | Admitting: Ophthalmology

## 2021-06-16 ENCOUNTER — Ambulatory Visit (HOSPITAL_COMMUNITY)
Admission: RE | Admit: 2021-06-16 | Discharge: 2021-06-16 | Disposition: A | Discharge status: Home / Self Care | Payer: Medicare Other | Attending: Ophthalmology | Admitting: Ophthalmology

## 2021-06-16 ENCOUNTER — Encounter (HOSPITAL_COMMUNITY)
Admission: RE | Disposition: A | Discharge status: Home / Self Care | Payer: Self-pay | Source: Home / Self Care | Attending: Ophthalmology

## 2021-06-16 ENCOUNTER — Other Ambulatory Visit: Payer: Self-pay

## 2021-06-16 DIAGNOSIS — E1136 Type 2 diabetes mellitus with diabetic cataract: Secondary | ICD-10-CM | POA: Insufficient documentation

## 2021-06-16 DIAGNOSIS — I10 Essential (primary) hypertension: Secondary | ICD-10-CM | POA: Insufficient documentation

## 2021-06-16 DIAGNOSIS — E039 Hypothyroidism, unspecified: Secondary | ICD-10-CM | POA: Diagnosis not present

## 2021-06-16 DIAGNOSIS — H25812 Combined forms of age-related cataract, left eye: Secondary | ICD-10-CM | POA: Insufficient documentation

## 2021-06-16 DIAGNOSIS — Z7984 Long term (current) use of oral hypoglycemic drugs: Secondary | ICD-10-CM | POA: Diagnosis not present

## 2021-06-16 DIAGNOSIS — J45909 Unspecified asthma, uncomplicated: Secondary | ICD-10-CM | POA: Insufficient documentation

## 2021-06-16 HISTORY — PX: CATARACT EXTRACTION W/PHACO: SHX586

## 2021-06-16 LAB — GLUCOSE, CAPILLARY: Glucose-Capillary: 133 mg/dL — ABNORMAL HIGH (ref 70–99)

## 2021-06-16 SURGERY — PHACOEMULSIFICATION, CATARACT, WITH IOL INSERTION
Anesthesia: Monitor Anesthesia Care | Site: Eye | Laterality: Left

## 2021-06-16 MED ORDER — MIDAZOLAM HCL 2 MG/2ML IJ SOLN
INTRAMUSCULAR | Status: DC | PRN
  Administered 2021-06-16: 2 mg via INTRAVENOUS

## 2021-06-16 MED ORDER — LIDOCAINE HCL (PF) 1 % IJ SOLN
INTRAOCULAR | Status: DC | PRN
  Administered 2021-06-16: 1 mL via OPHTHALMIC

## 2021-06-16 MED ORDER — EPINEPHRINE PF 1 MG/ML IJ SOLN
INTRAOCULAR | Status: DC | PRN
  Administered 2021-06-16: 500 mL

## 2021-06-16 MED ORDER — TROPICAMIDE 1 % OP SOLN
1.0000 [drp] | OPHTHALMIC | Status: AC | PRN
  Administered 2021-06-16 (×3): 1 [drp] via OPHTHALMIC
  Filled 2021-06-16: qty 2

## 2021-06-16 MED ORDER — MIDAZOLAM HCL 2 MG/2ML IJ SOLN
INTRAMUSCULAR | Status: AC
  Filled 2021-06-16: qty 2

## 2021-06-16 MED ORDER — POVIDONE-IODINE 5 % OP SOLN
OPHTHALMIC | Status: DC | PRN
  Administered 2021-06-16: 1 via OPHTHALMIC

## 2021-06-16 MED ORDER — EPINEPHRINE PF 1 MG/ML IJ SOLN
INTRAMUSCULAR | Status: AC
  Filled 2021-06-16: qty 1

## 2021-06-16 MED ORDER — BSS IO SOLN
INTRAOCULAR | Status: DC | PRN
  Administered 2021-06-16: 15 mL via INTRAOCULAR

## 2021-06-16 MED ORDER — PHENYLEPHRINE-KETOROLAC 1-0.3 % IO SOLN
INTRAOCULAR | Status: AC
  Filled 2021-06-16: qty 4

## 2021-06-16 MED ORDER — SODIUM HYALURONATE 23MG/ML IO SOSY
PREFILLED_SYRINGE | INTRAOCULAR | Status: DC | PRN
  Administered 2021-06-16: 0.6 mL via INTRAOCULAR

## 2021-06-16 MED ORDER — NEOMYCIN-POLYMYXIN-DEXAMETH 3.5-10000-0.1 OP SUSP
OPHTHALMIC | Status: DC | PRN
  Administered 2021-06-16: 1 [drp] via OPHTHALMIC

## 2021-06-16 MED ORDER — PHENYLEPHRINE HCL 2.5 % OP SOLN
1.0000 [drp] | OPHTHALMIC | Status: AC | PRN
  Administered 2021-06-16 (×3): 1 [drp] via OPHTHALMIC

## 2021-06-16 MED ORDER — SODIUM HYALURONATE 10 MG/ML IO SOLUTION
PREFILLED_SYRINGE | INTRAOCULAR | Status: DC | PRN
  Administered 2021-06-16: 0.85 mL via INTRAOCULAR

## 2021-06-16 MED ORDER — LIDOCAINE HCL 3.5 % OP GEL
1.0000 "application " | Freq: Once | OPHTHALMIC | Status: AC
  Administered 2021-06-16: 1 via OPHTHALMIC

## 2021-06-16 MED ORDER — STERILE WATER FOR IRRIGATION IR SOLN
Status: DC | PRN
  Administered 2021-06-16: 250 mL

## 2021-06-16 MED ORDER — SODIUM CHLORIDE 0.9% FLUSH
INTRAVENOUS | Status: DC | PRN
  Administered 2021-06-16: 5 mL via INTRAVENOUS

## 2021-06-16 MED ORDER — TETRACAINE HCL 0.5 % OP SOLN
1.0000 [drp] | OPHTHALMIC | Status: AC | PRN
  Administered 2021-06-16 (×3): 1 [drp] via OPHTHALMIC

## 2021-06-16 SURGICAL SUPPLY — 10 items
CLOTH BEACON ORANGE TIMEOUT ST (SAFETY) ×2 IMPLANT
EYE SHIELD UNIVERSAL CLEAR (GAUZE/BANDAGES/DRESSINGS) ×2 IMPLANT
GLOVE SURG UNDER POLY LF SZ7 (GLOVE) ×4 IMPLANT
NEEDLE HYPO 18GX1.5 BLUNT FILL (NEEDLE) ×2 IMPLANT
PAD ARMBOARD 7.5X6 YLW CONV (MISCELLANEOUS) ×2 IMPLANT
RayOne EMV US (Intraocular Lens) ×2 IMPLANT
SYR TB 1ML LL NO SAFETY (SYRINGE) ×2 IMPLANT
TAPE SURG TRANSPORE 1 IN (GAUZE/BANDAGES/DRESSINGS) ×1 IMPLANT
TAPE SURGICAL TRANSPORE 1 IN (GAUZE/BANDAGES/DRESSINGS) ×1
WATER STERILE IRR 250ML POUR (IV SOLUTION) ×2 IMPLANT

## 2021-06-16 NOTE — Anesthesia Preprocedure Evaluation (Signed)
Anesthesia Evaluation  Patient identified by MRN, date of birth, ID band Patient awake    Reviewed: Allergy & Precautions, NPO status , Patient's Chart, lab work & pertinent test results  Airway Mallampati: II  TM Distance: >3 FB Neck ROM: Full    Dental  (+) Dental Advisory Given, Chipped   Pulmonary asthma ,    Pulmonary exam normal breath sounds clear to auscultation       Cardiovascular Exercise Tolerance: Good hypertension, Pt. on medications + Valvular Problems/Murmurs  Rhythm:Regular Rate:Tachycardia     Neuro/Psych negative neurological ROS  negative psych ROS   GI/Hepatic Neg liver ROS, GERD  ,  Endo/Other  diabetes, Well Controlled, Type 2, Oral Hypoglycemic AgentsHypothyroidism   Renal/GU negative Renal ROS     Musculoskeletal  (+) Arthritis , Osteoarthritis,    Abdominal   Peds  Hematology   Anesthesia Other Findings   Reproductive/Obstetrics                             Anesthesia Physical  Anesthesia Plan  ASA: 2  Anesthesia Plan: MAC   Post-op Pain Management:    Induction:   PONV Risk Score and Plan:   Airway Management Planned: Nasal Cannula and Natural Airway  Additional Equipment:   Intra-op Plan:   Post-operative Plan:   Informed Consent: I have reviewed the patients History and Physical, chart, labs and discussed the procedure including the risks, benefits and alternatives for the proposed anesthesia with the patient or authorized representative who has indicated his/her understanding and acceptance.     Dental advisory given  Plan Discussed with: CRNA and Surgeon  Anesthesia Plan Comments:         Anesthesia Quick Evaluation

## 2021-06-16 NOTE — Anesthesia Postprocedure Evaluation (Signed)
Anesthesia Post Note  Patient: Leonette Nutting  Procedure(s) Performed: CATARACT EXTRACTION PHACO AND INTRAOCULAR LENS PLACEMENT (IOC) (Left: Eye)  Patient location during evaluation: Phase II Anesthesia Type: MAC Level of consciousness: awake Pain management: pain level controlled Vital Signs Assessment: post-procedure vital signs reviewed and stable Respiratory status: spontaneous breathing and respiratory function stable Cardiovascular status: blood pressure returned to baseline and stable Postop Assessment: no headache and no apparent nausea or vomiting Anesthetic complications: no Comments: Late entry   No notable events documented.   Last Vitals:  Vitals:   06/16/21 1007 06/16/21 1127  BP: (!) 158/76 100/64  Pulse: (!) 104 (!) 104  Resp: 17 17  Temp: 36.8 C 36.9 C  SpO2: 100% 98%    Last Pain:  Vitals:   06/16/21 1127  TempSrc: Oral  PainSc: [place]

## 2021-06-16 NOTE — Op Note (Signed)
Date of procedure: 06/16/21  Pre-operative diagnosis: Visually significant age-related combined cataract, Left Eye (H25.812)  Post-operative diagnosis: Visually significant age-related combined cataract, Left Eye (H25.812)  Procedure: Removal of cataract via phacoemulsification and insertion of intra-ocular lens Rayner [id number] +15.0D into the capsular bag of the Left Eye  Attending surgeon: Gerda Diss. Shelly Spenser, MD, MA  Anesthesia: MAC, Topical Akten  Complications: None  Estimated Blood Loss: <24m (minimal)  Specimens: None  Implants: As above  Indications:  Visually significant age-related cataract, Left Eye  Procedure:  The patient was seen and identified in the pre-operative area. The operative eye was identified and dilated.  The operative eye was marked.  Topical anesthesia was administered to the operative eye.     The patient was then to the operative suite and placed in the supine position.  A timeout was performed confirming the patient, procedure to be performed, and all other relevant information.   The patient's face was prepped and draped in the usual fashion for intra-ocular surgery.  A lid speculum was placed into the operative eye and the surgical microscope moved into place and focused.  An inferotemporal paracentesis was created using a 20 gauge paracentesis blade.  Shugarcaine was injected into the anterior chamber.  Viscoelastic was injected into the anterior chamber.  A temporal clear-corneal main wound incision was created using a 2.473mmicrokeratome.  A continuous curvilinear capsulorrhexis was initiated using an irrigating cystitome and completed using capsulorrhexis forceps.  Hydrodissection and hydrodeliniation were performed.  Viscoelastic was injected into the anterior chamber.  A phacoemulsification handpiece and a chopper as a second instrument were used to remove the nucleus and epinucleus. The irrigation/aspiration handpiece was used to remove any remaining  cortical material.   The capsular bag was reinflated with viscoelastic, checked, and found to be intact.  The intraocular lens was inserted into the capsular bag.  The irrigation/aspiration handpiece was used to remove any remaining viscoelastic.  The clear corneal wound and paracentesis wounds were then hydrated and checked with Weck-Cels to be watertight.  The lid-speculum was removed.  The drape was removed.  The patient's face was cleaned with a wet and dry 4x4.   Maxitrol was instilled in the eye. A clear shield was taped over the eye. The patient was taken to the post-operative care unit in good condition, having tolerated the procedure well.  Post-Op Instructions: [facility] and will receive all other orders and instructions.

## 2021-06-16 NOTE — Discharge Instructions (Addendum)
Please discharge patient when stable, will follow up today with Dr. Wrzosek at the [facility] [place] office immediately following discharge.  Leave shield in place until visit.  All paperwork with discharge instructions will be given at the office.  [facility] Address:  [address]  [place], [place] [address]  

## 2021-06-16 NOTE — Anesthesia Procedure Notes (Signed)
Procedure Name: MAC Date/Time: 06/16/2021 11:06 AM Performed by: Orlie Dakin, CRNA Pre-anesthesia Checklist: Patient identified, Emergency Drugs available, Suction available and Patient being monitored Patient Re-evaluated:Patient Re-evaluated prior to induction Oxygen Delivery Method: Nasal cannula Placement Confirmation: positive ETCO2

## 2021-06-16 NOTE — Interval H&P Note (Signed)
History and Physical Interval Note:  06/16/2021 10:53 AM  Rebecca Arellano  has presented today for surgery, with the diagnosis of nuclear cataract left eye.  The various methods of treatment have been discussed with the patient and family. After consideration of risks, benefits and other options for treatment, the patient has consented to  Procedure(s) with comments: CATARACT EXTRACTION PHACO AND INTRAOCULAR LENS PLACEMENT (IOC) (Left) - left as a surgical intervention.  The patient's history has been reviewed, patient examined, no change in status, stable for surgery.  I have reviewed the patient's chart and labs.  Questions were answered to the patient's satisfaction.     [name]

## 2021-06-16 NOTE — Transfer of Care (Signed)
Immediate Anesthesia Transfer of Care Note  Patient: Rebecca Arellano  Procedure(s) Performed: CATARACT EXTRACTION PHACO AND INTRAOCULAR LENS PLACEMENT (IOC) (Left: Eye)  Patient Location: Short Stay  Anesthesia Type:MAC  Level of Consciousness: awake, alert  and oriented  Airway & Oxygen Therapy: Patient Spontanous Breathing  Post-op Assessment: Report given to RN and Post -op Vital signs reviewed and stable  Post vital signs: Reviewed and stable  Last Vitals:  Vitals Value Taken Time  BP    Temp    Pulse    Resp    SpO2      Last Pain:  Vitals:   06/16/21 1007  TempSrc: Oral  PainSc: 0-No pain         Complications: No notable events documented.

## 2021-06-19 ENCOUNTER — Encounter (HOSPITAL_COMMUNITY): Payer: Self-pay | Admitting: Ophthalmology

## 2021-09-19 ENCOUNTER — Encounter: Payer: Self-pay | Admitting: *Deleted

## 2021-09-26 ENCOUNTER — Other Ambulatory Visit (HOSPITAL_COMMUNITY): Payer: Self-pay | Admitting: Family Medicine

## 2021-09-26 ENCOUNTER — Ambulatory Visit (HOSPITAL_COMMUNITY)
Admission: RE | Admit: 2021-09-26 | Discharge: 2021-09-26 | Disposition: A | Discharge status: Home / Self Care | Payer: Medicare Other | Source: Ambulatory Visit | Attending: Family Medicine | Admitting: Family Medicine

## 2021-09-26 ENCOUNTER — Other Ambulatory Visit: Payer: Self-pay

## 2021-09-26 DIAGNOSIS — M545 Low back pain, unspecified: Secondary | ICD-10-CM

## 2021-11-11 IMAGING — MR MR LUMBAR SPINE W/O CM
4 of 5 series · 12 of 48 positions shown · non-contrast
Comparison: X-ray 08/18/2019, MRI 03/19/2014

CLINICAL DATA: Low back pain radiating to the right hip for 2
months

EXAM:
MRI LUMBAR SPINE WITHOUT CONTRAST
TECHNIQUE: Multiplanar, multisequence MR imaging of the lumbar spine was
performed. No intravenous contrast was administered.

[Series 3: T2 · sagittal · 4.0mm · 0.44mm/px · 3 of 13 slices shown (1 of 3)]
[im 3/13]
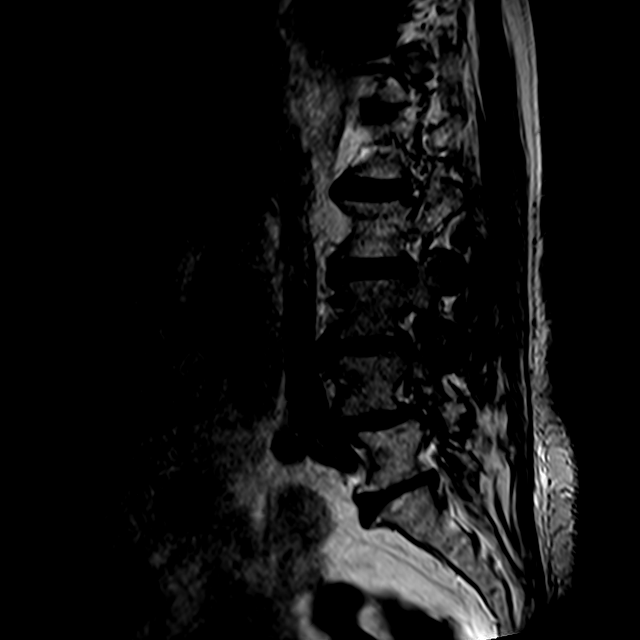
[im 8/13]
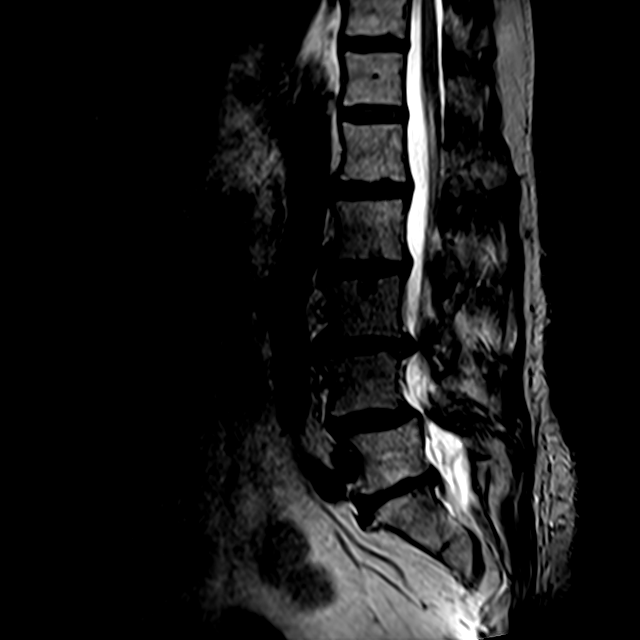
[im 13/13]
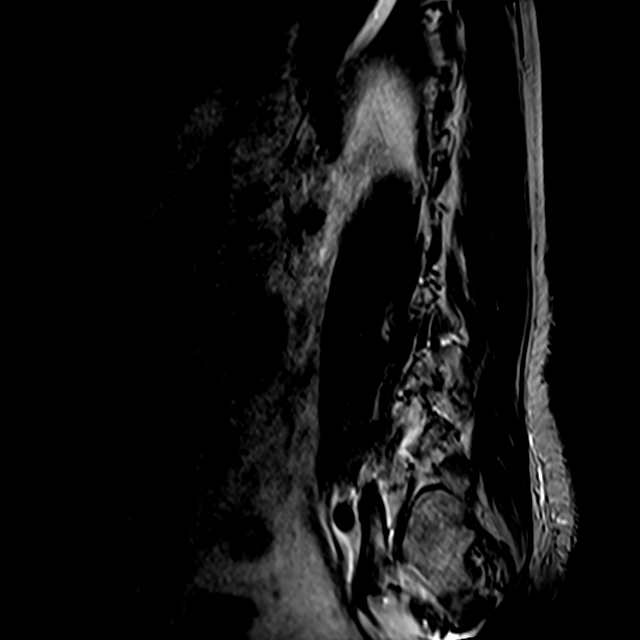

[Series 4: T1 · sagittal · 4.0mm · 0.44mm/px · 3 of 13 slices shown]
[im 1/13]
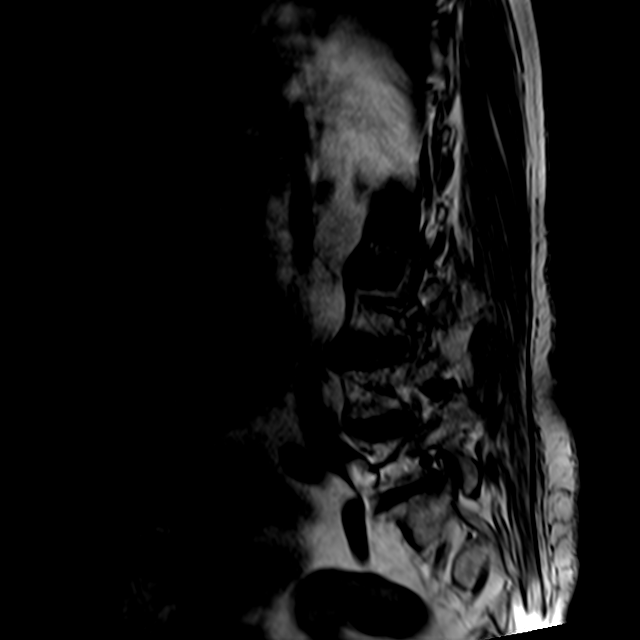
[im 7/13]
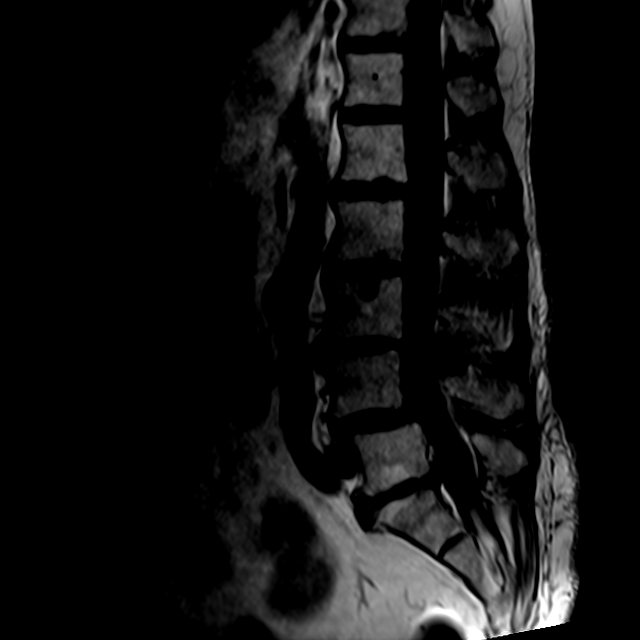
[im 13/13]
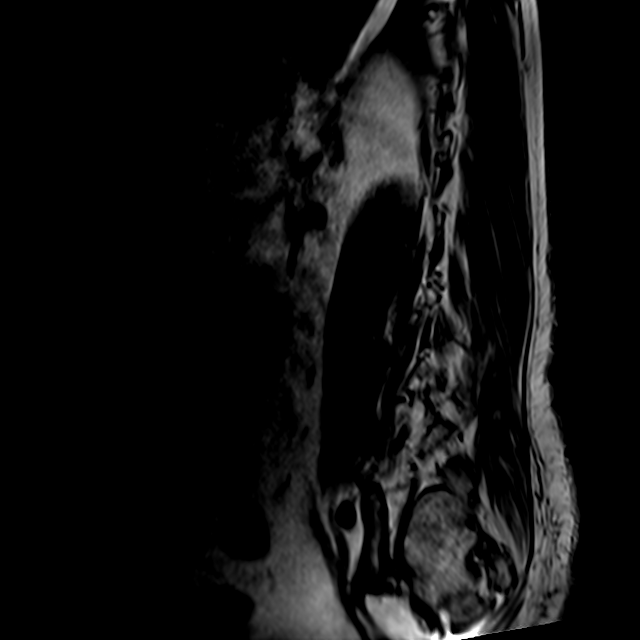

[Series 5: T2 · sagittal · 4.0mm · 0.44mm/px · 3 of 16 slices shown (2 of 3)]
[im 3/16]
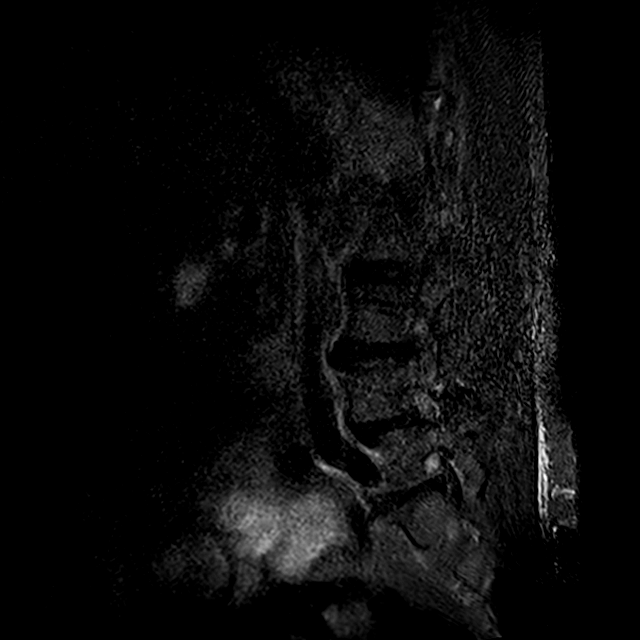
[im 8/16]
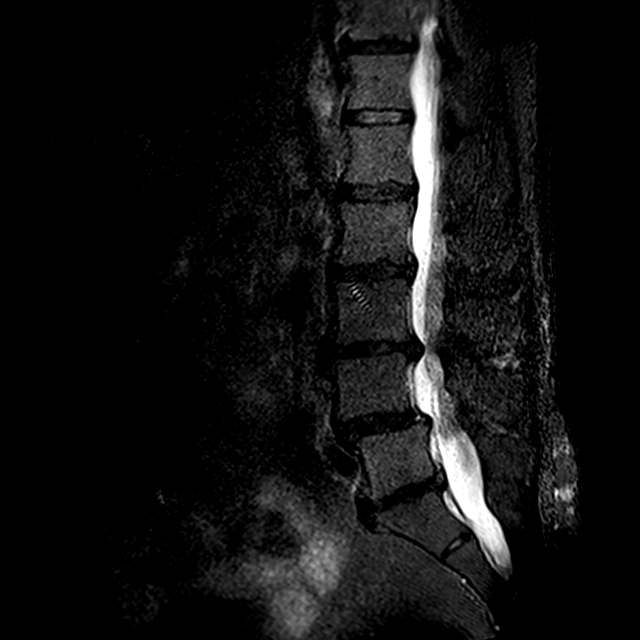
[im 13/16]
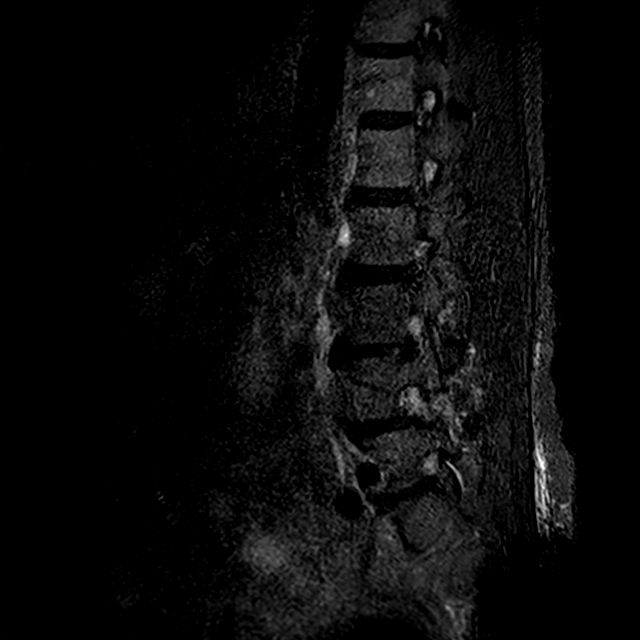

[Series 6: T2 · axial · 4.0mm · 0.25mm/px · z∈[-18,+116]mm · 3 of 38 slices shown (3 of 3)]
[im 6/38]
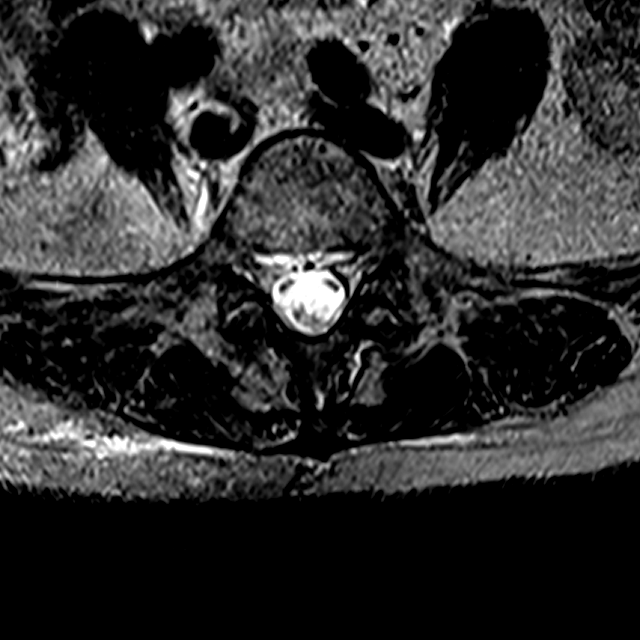
[im 19/38]
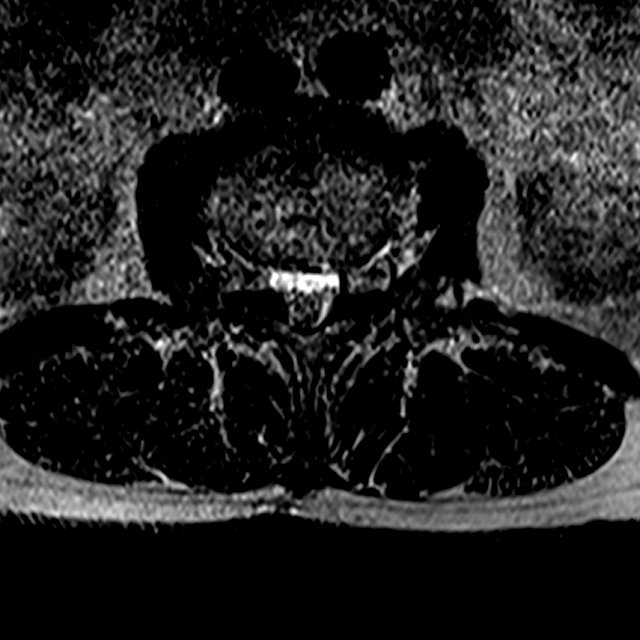
[im 32/38]
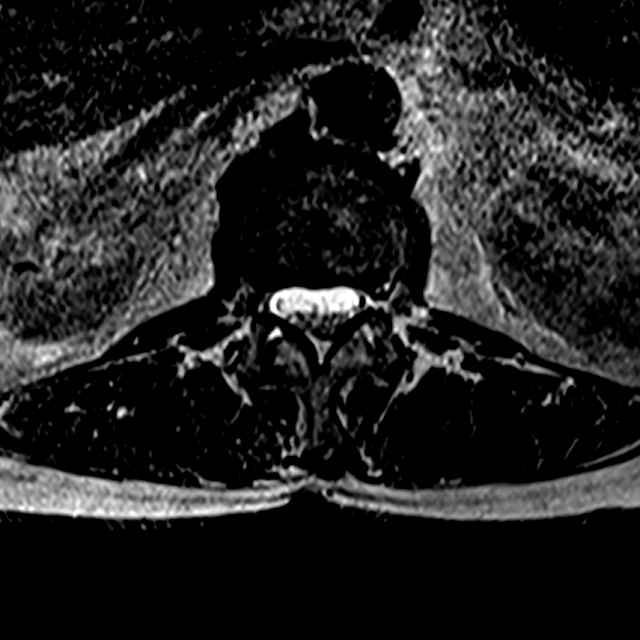

[12 of 48 positions shown; findings below may reference images not displayed]

FINDINGS: Segmentation:  Standard.

Alignment: 6 mm grade 1 anterolisthesis L4 on L5. Straightening of
the lumbar lordosis.

Vertebrae: No fracture, evidence of discitis, or suspicious bone
lesion. Incidental L2 intraosseous hemangioma. Multilevel discogenic
endplate marrow changes.

Conus medullaris and cauda equina: Conus extends to the L1 level.
Conus and cauda equina appear normal.

Paraspinal and other soft tissues: Cortically based T2 hyperintense
lesionswithin the right kidney, incompletely characterized, but most
likely represent cysts.

Disc levels:

T12-L1: No significant disc protrusion, foraminal stenosis, or canal
stenosis.

L1-L2: Tiny central disc protrusion. No foraminal or canal stenosis.
Unchanged.

L2-L3: Diffuse disc bulge results in mild canal stenosis. No
significant foraminal stenosis. Findings progressed from prior.

L3-L4: Diffuse disc bulge with bilateral facet arthrosis and
ligamentous hypertrophy resulting in moderate to severe canal
stenosis and moderate bilateral subarticular recess stenosis. Mild
bilateral foraminal stenosis. No significant interval progression
from prior.

L4-L5: Disc uncovering with mild diffuse disc bulge, eccentric to
the right. Bilateral facet arthrosis. There is mild canal stenosis
and moderate right greater than left subarticular recess stenosis.
Mild right foraminal stenosis. Overall, findings slightly progressed
from prior.

L5-S1: Mild diffuse disc bulge with endplate spurring and mild facet
arthrosis resulting in moderate right foraminal stenosis. No canal
stenosis. Unchanged.
IMPRESSION: 1. Multilevel lumbar spondylosis, slightly progressed at L3-L4 and
L4-L5.
2. Moderate-to-severe canal stenosis and moderate bilateral
subarticular recess stenosis at L3-L4.
3. Mild canal stenosis and right foraminal stenosis at L4-L5.
4. Moderate right foraminal stenosis at L5-S1.
5. Chronic grade 1 anterolisthesis of L4 on L5.

## 2021-11-21 ENCOUNTER — Other Ambulatory Visit (HOSPITAL_COMMUNITY): Payer: Self-pay | Admitting: Family Medicine

## 2021-11-21 DIAGNOSIS — M5442 Lumbago with sciatica, left side: Secondary | ICD-10-CM

## 2021-12-07 ENCOUNTER — Ambulatory Visit (HOSPITAL_COMMUNITY)
Admission: RE | Admit: 2021-12-07 | Discharge: 2021-12-07 | Disposition: A | Discharge status: Home / Self Care | Payer: Medicare Other | Source: Ambulatory Visit | Attending: Family Medicine | Admitting: Family Medicine

## 2021-12-07 DIAGNOSIS — M5442 Lumbago with sciatica, left side: Secondary | ICD-10-CM | POA: Diagnosis present
